# Patient Record
Sex: Male | Born: 1980 | Race: White | Hispanic: No | Marital: Married | Smoking: Former smoker
Health system: Southern US, Community
[De-identification: ages and names within clinical notes are randomized; demographics above are authoritative.]

## PROBLEM LIST (undated history)

## (undated) DIAGNOSIS — T8859XA Other complications of anesthesia, initial encounter: Secondary | ICD-10-CM

## (undated) DIAGNOSIS — R112 Nausea with vomiting, unspecified: Secondary | ICD-10-CM

## (undated) DIAGNOSIS — Z8489 Family history of other specified conditions: Secondary | ICD-10-CM

## (undated) HISTORY — PX: SHOULDER SURGERY: SHX246

## (undated) HISTORY — PX: HAND SURGERY: SHX662

---
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
## 2000-01-07 ENCOUNTER — Emergency Department (HOSPITAL_COMMUNITY): Admission: EM | Admit: 2000-01-07 | Discharge: 2000-01-07 | Payer: Self-pay | Admitting: Emergency Medicine

## 2002-06-27 ENCOUNTER — Ambulatory Visit (HOSPITAL_BASED_OUTPATIENT_CLINIC_OR_DEPARTMENT_OTHER): Admission: RE | Admit: 2002-06-27 | Discharge: 2002-06-27 | Payer: Self-pay | Admitting: Orthopedic Surgery

## 2005-06-16 ENCOUNTER — Emergency Department (HOSPITAL_COMMUNITY): Admission: EM | Admit: 2005-06-16 | Discharge: 2005-06-16 | Payer: Self-pay | Admitting: Emergency Medicine

## 2007-06-01 ENCOUNTER — Emergency Department (HOSPITAL_COMMUNITY): Admission: EM | Admit: 2007-06-01 | Discharge: 2007-06-01 | Payer: Self-pay | Admitting: Emergency Medicine

## 2012-02-13 ENCOUNTER — Emergency Department (HOSPITAL_COMMUNITY): Payer: BC Managed Care – PPO

## 2012-02-13 ENCOUNTER — Emergency Department (HOSPITAL_COMMUNITY)
Admission: EM | Admit: 2012-02-13 | Discharge: 2012-02-13 | Disposition: A | Discharge status: Home / Self Care | Payer: BC Managed Care – PPO | Source: Home / Self Care

## 2012-02-13 ENCOUNTER — Emergency Department (INDEPENDENT_AMBULATORY_CARE_PROVIDER_SITE_OTHER): Payer: BC Managed Care – PPO

## 2012-02-13 ENCOUNTER — Encounter (HOSPITAL_COMMUNITY): Payer: Self-pay | Admitting: Emergency Medicine

## 2012-02-13 DIAGNOSIS — S6390XA Sprain of unspecified part of unspecified wrist and hand, initial encounter: Secondary | ICD-10-CM

## 2012-02-13 DIAGNOSIS — S63619A Unspecified sprain of unspecified finger, initial encounter: Secondary | ICD-10-CM

## 2012-02-13 DIAGNOSIS — IMO0002 Reserved for concepts with insufficient information to code with codable children: Secondary | ICD-10-CM

## 2012-02-13 NOTE — ED Notes (Signed)
Reports receiving tetanus 2 weeks ago: 2013.
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
## 2012-02-14 ENCOUNTER — Other Ambulatory Visit: Payer: Self-pay | Admitting: Internal Medicine

## 2012-02-14 MED ORDER — MUPIROCIN 2 % EX OINT: TOPICAL_OINTMENT | Freq: Three times a day (TID) | CUTANEOUS | Status: DC

## 2012-02-16 NOTE — ED Provider Notes (Signed)
Medical screening examination/treatment/procedure(s) were performed by resident physician or non-physician practitioner and as supervising physician I was immediately available for consultation/collaboration.   Zaeem Kandel DOUGLAS MD.    Jestina Stephani D Arieh Bogue, MD 02/16/12 0830 

## 2012-02-20 ENCOUNTER — Telehealth: Payer: Self-pay | Admitting: Radiology

## 2012-02-20 NOTE — Telephone Encounter (Signed)
Rx for Mupirocin sent through in error, it is cancelled at [facility]

## 2012-12-18 ENCOUNTER — Ambulatory Visit: Payer: BC Managed Care – PPO | Admitting: Family Medicine

## 2012-12-18 VITALS — BP 112/62 | HR 60 | Temp 97.6°F | Resp 18 | Ht 75.0 in | Wt 207.0 lb

## 2012-12-18 DIAGNOSIS — R002 Palpitations: Secondary | ICD-10-CM

## 2012-12-18 LAB — POCT CBC
Granulocyte percent: 57.4 %G (ref 37–80)
HCT, POC: 44.5 % (ref 43.5–53.7)
Hemoglobin: 14.2 g/dL (ref 14.1–18.1)
Lymph, poc: 1.8 (ref 0.6–3.4)
MCH, POC: 30.9 pg (ref 27–31.2)
MCHC: 31.9 g/dL (ref 31.8–35.4)
MCV: 97 fL (ref 80–97)
MID (cbc): 0.4 (ref 0–0.9)
MPV: 12.4 fL (ref 0–99.8)
POC Granulocyte: 3 (ref 2–6.9)
POC LYMPH PERCENT: 34.4 % (ref 10–50)
POC MID %: 8.2 % (ref 0–12)
Platelet Count, POC: 133 10*3/uL — AB (ref 142–424)
RBC: 4.59 M/uL — AB (ref 4.69–6.13)
RDW, POC: 13.8 %
WBC: 5.3 10*3/uL (ref 4.6–10.2)

## 2012-12-18 NOTE — Progress Notes (Signed)
[facility]:  Office Visit  Chief Complaint:  Chief Complaint  Patient presents with  . Irregular Heart Beat    noticed saturday and again last night, unable to sleep     HPI: Ricky Berger is a 32 y.o. male who complains of  Intermittent midcentral non radiating chest palpitations, "feels like  Muscle spasm". Denies stimulant use excpet for alcohol prior, he denies any other sxs ie CP, SOB, diaphoresis, paresthesia, HA, n/v/abd pain Denies  HTN, XOL. Smoking-- quit 2 weeks ago, Saturday evening, intermittent palpitations started and lasted  for 30 minutes Happended again while at rest watching football, this second episode last night lasted for 45 minutes, Prior to this all starting on Saturday he had gone to a bachelor party and had drank a lot  Father has speeding heart beat.    History reviewed. No pertinent past medical history. Past Surgical History  Procedure Laterality Date  . Shoulder surgery     History   Social History  . Marital Status: Married    Spouse Name: N/A    Number of Children: N/A  . Years of Education: N/A   Social History Main Topics  . Smoking status: Former Games developer  . Smokeless tobacco: None  . Alcohol Use: Yes  . Drug Use: No  . Sexually Active: None   Other Topics Concern  . None   Social History Narrative  . None   History reviewed. No pertinent family history. No Known Allergies Prior to Admission medications   Medication Sig Start Date End Date Taking? Authorizing Provider  Celecoxib (CELEBREX PO) Take by mouth.    Historical Provider, MD     ROS: The patient denies fevers, chills, night sweats, unintentional weight loss, chest pain, wheezing, dyspnea on exertion, nausea, vomiting, abdominal pain, dysuria, hematuria, melena, numbness, weakness, or tingling.   All other systems have been reviewed and were otherwise negative with the exception of those mentioned in the HPI and as above.    PHYSICAL  EXAM: Filed Vitals:   12/18/12 1043  BP: 112/62  Pulse: 60  Temp: 97.6 F (36.4 C)  Resp: 18   Filed Vitals:   12/18/12 1043  Height: 6\' 3"  (1.905 m)  Weight: 207 lb (93.895 kg)   Body mass index is 25.87 kg/(m^2).  General: Alert, no acute distress HEENT:  Normocephalic, atraumatic, oropharynx patent.  Cardiovascular:  Regular rate and rhythm, no rubs murmurs or gallops.  No Carotid bruits, radial pulse intact. No pedal edema.  Respiratory: Clear to auscultation bilaterally.  No wheezes, rales, or rhonchi.  No cyanosis, no use of accessory musculature GI: No organomegaly, abdomen is soft and non-tender, positive bowel sounds.  No masses. Skin: No rashes. Neurologic: Facial musculature symmetric. Psychiatric: Patient is appropriate throughout our interaction. Lymphatic: No cervical lymphadenopathy Musculoskeletal: Gait intact.   LABS: Results for orders placed in visit on 12/18/12  POCT CBC      Result Value Range   WBC 5.3  4.6 - 10.2 K/uL   Lymph, poc 1.8  0.6 - 3.4   POC LYMPH PERCENT 34.4  10 - 50 %L   MID (cbc) 0.4  0 - 0.9   POC MID % 8.2  0 - 12 %M   POC Granulocyte 3.0  2 - 6.9   Granulocyte percent 57.4  37 - 80 %G   RBC 4.59 (*) 4.69 - 6.13 M/uL   Hemoglobin 14.2  14.1 - 18.1 g/dL   HCT, POC 16.1  09.6 -  53.7 %   MCV 97.0  80 - 97 fL   MCH, POC 30.9  27 - 31.2 pg   MCHC 31.9  31.8 - 35.4 g/dL   RDW, POC 96.0     Platelet Count, POC 133 (*) 142 - 424 K/uL   MPV 12.4  0 - 99.8 fL     EKG/XRAY:   Primary read interpreted by Dr. Conley Rolls at [facility]. EKG sinus brady without ST elevation/depression or Q waves, he is a cyclist   Encounter Diagnosis  Name Primary?  . Palpitations Yes   Labs pending Will consider event monitor if have recurret sxs F/u prn Go to ER prn   Adlai Nieblas PHUONG, DO 12/18/2012 5:32 PM

## 2012-12-18 NOTE — Patient Instructions (Addendum)
Palpitations  A palpitation is the feeling that your heartbeat is irregular or is faster than normal. It may feel like your heart is fluttering or skipping a beat. Palpitations are usually not a serious problem. However, in some cases, you may need further medical evaluation. CAUSES  Palpitations can be caused by:  Smoking.  Caffeine or other stimulants, such as diet pills or energy drinks.  Alcohol.  Stress and anxiety.  Strenuous physical activity.  Fatigue.  Certain medicines.  Heart disease, especially if you have a history of arrhythmias. This includes atrial fibrillation, atrial flutter, or supraventricular tachycardia.  An improperly working pacemaker or defibrillator. DIAGNOSIS  To find the cause of your palpitations, your caregiver will take your history and perform a physical exam. Tests may also be done, including:  Electrocardiography (ECG). This test records the heart's electrical activity.  Cardiac monitoring. This allows your caregiver to monitor your heart rate and rhythm in real time.  Holter monitor. This is a portable device that records your heartbeat and can help diagnose heart arrhythmias. It allows your caregiver to track your heart activity for several days, if needed.  Stress tests by exercise or by giving medicine that makes the heart beat faster. TREATMENT  Treatment of palpitations depends on the cause of your symptoms and can vary greatly. Most cases of palpitations do not require any treatment other than time, relaxation, and monitoring your symptoms. Other causes, such as atrial fibrillation, atrial flutter, or supraventricular tachycardia, usually require further treatment. HOME CARE INSTRUCTIONS   Avoid:  Caffeinated coffee, tea, soft drinks, diet pills, and energy drinks.  Chocolate.  Alcohol.  Stop smoking if you smoke.  Reduce your stress and anxiety. Things that can help you relax include:  A method that measures bodily functions so  you can learn to control them (biofeedback).  Yoga.  Meditation.  Physical activity such as swimming, jogging, or walking.  Get plenty of rest and sleep. SEEK MEDICAL CARE IF:   You continue to have a fast or irregular heartbeat beyond 24 hours.  Your palpitations occur more often. SEEK IMMEDIATE MEDICAL CARE IF:  You develop chest pain or shortness of breath.  You have a severe headache.  You feel dizzy, or you faint. MAKE SURE YOU:  Understand these instructions.  Will watch your condition.  Will get help right away if you are not doing well or get worse. Document Released: 05/05/2000 Document Revised: 11/07/2011 Document Reviewed: 07/07/2011 ExitCare Patient Information 2014 [employer].  

## 2012-12-19 ENCOUNTER — Telehealth: Payer: Self-pay

## 2012-12-19 DIAGNOSIS — R002 Palpitations: Secondary | ICD-10-CM

## 2012-12-19 LAB — COMPREHENSIVE METABOLIC PANEL WITH GFR
ALT: 19 IU/L (ref 0–44)
AST: 17 IU/L (ref 0–40)
Albumin/Globulin Ratio: 1.8 (ref 1.1–2.5)
Calcium: 9.5 mg/dL (ref 8.7–10.2)
Chloride: 104 mmol/L (ref 97–108)
GFR calc non Af Amer: 87 mL/min/{1.73_m2} (ref >59)
Potassium: 4.5 mmol/L (ref 3.5–5.2)
Sodium: 142 mmol/L (ref 134–144)
Total Bilirubin: 0.4 mg/dL (ref 0.0–1.2)

## 2012-12-19 LAB — COMPREHENSIVE METABOLIC PANEL
Albumin: 4.4 g/dL (ref 3.5–5.5)
Alkaline Phosphatase: 46 IU/L (ref 39–117)
BUN/Creatinine Ratio: 14 (ref 8–19)
BUN: 15 mg/dL (ref 6–20)
CO2: 28 mmol/L (ref 18–29)
Creatinine, Ser: 1.11 mg/dL (ref 0.76–1.27)
GFR calc Af Amer: 100 mL/min/{1.73_m2} (ref >59)
Globulin, Total: 2.4 g/dL (ref 1.5–4.5)
Glucose: 89 mg/dL (ref 65–99)
Total Protein: 6.8 g/dL (ref 6.0–8.5)

## 2012-12-19 LAB — TSH: TSH: 0.961 u[IU]/mL (ref 0.450–4.500)

## 2012-12-19 NOTE — Telephone Encounter (Signed)
Pt states that he was instructed to call Dr.Le if he had heart papillations again.  Best# [phone]

## 2012-12-19 NOTE — Telephone Encounter (Signed)
LM for patient taht I will order event monitor for him since he is having heart palpitations again. His labs results were also given. Advise to go to ER if have CP or SPB or other new symptoms

## 2013-04-08 ENCOUNTER — Other Ambulatory Visit: Payer: Self-pay | Admitting: Internal Medicine

## 2013-05-18 ENCOUNTER — Other Ambulatory Visit: Payer: Self-pay | Admitting: Oncology

## 2013-07-12 ENCOUNTER — Ambulatory Visit (INDEPENDENT_AMBULATORY_CARE_PROVIDER_SITE_OTHER): Payer: BC Managed Care – PPO | Admitting: Family Medicine

## 2013-07-12 VITALS — BP 102/60 | HR 60 | Temp 97.8°F | Resp 16 | Ht 74.75 in | Wt 210.0 lb

## 2013-07-12 DIAGNOSIS — N509 Disorder of male genital organs, unspecified: Secondary | ICD-10-CM

## 2013-07-12 DIAGNOSIS — N453 Epididymo-orchitis: Secondary | ICD-10-CM

## 2013-07-12 DIAGNOSIS — N5082 Scrotal pain: Secondary | ICD-10-CM

## 2013-07-12 DIAGNOSIS — N451 Epididymitis: Secondary | ICD-10-CM

## 2013-07-12 MED ORDER — DICLOFENAC SODIUM 75 MG PO TBEC: 75.0000 mg | DELAYED_RELEASE_TABLET | Freq: Two times a day (BID) | ORAL | Status: DC

## 2013-07-12 MED ORDER — DOXYCYCLINE HYCLATE 100 MG PO CAPS: 100.0000 mg | ORAL_CAPSULE | Freq: Two times a day (BID) | ORAL | Status: DC

## 2013-07-12 NOTE — Patient Instructions (Addendum)
Take diclofenac one twice daily with food Take doxycycline one twice daily   Avoid heavy lifting or straining  If symptoms persist in 10-14 days contact me for urology referral, sooner if worse    Epididymitis Epididymitis is a swelling (inflammation) of the epididymis. The epididymis is a cord-like structure along the back part of the testicle. Epididymitis is usually, but not always, caused by infection. This is usually a sudden problem beginning with chills, fever and pain behind the scrotum and in the testicle. There may be swelling and redness of the testicle. DIAGNOSIS  Physical examination will reveal a tender, swollen epididymis. Sometimes, cultures are obtained from the urine or from prostate secretions to help find out if there is an infection or if the cause is a different problem. Sometimes, blood work is performed to see if your white blood cell count is elevated and if a germ (bacterial) or viral infection is present. Using this knowledge, an appropriate medicine which kills germs (antibiotic) can be chosen by your caregiver. A viral infection causing epididymitis will most often go away (resolve) without treatment. HOME CARE INSTRUCTIONS   Hot sitz baths for 20 minutes, 4 times per day, may help relieve pain.  Only take over-the-counter or prescription medicines for pain, discomfort or fever as directed by your caregiver.  Take all medicines, including antibiotics, as directed. Take the antibiotics for the full prescribed length of time even if you are feeling better.  It is very important to keep all follow-up appointments. SEEK IMMEDIATE MEDICAL CARE IF:   You have a fever.  You have pain not relieved with medicines.  You have any worsening of your problems.  Your pain seems to come and go.  You develop pain, redness, and swelling in the scrotum and surrounding areas. MAKE SURE YOU:   Understand these instructions.  Will watch your condition.  Will get help  right away if you are not doing well or get worse. Document Released: 05/05/2000 Document Revised: 07/31/2011 Document Reviewed: 03/25/2009 [facility] Patient Information 2014 [employer]vant[employer]Care, [place].

## 2013-07-12 NOTE — Progress Notes (Signed)
Subjective: Patient has been having right scrotal pain for the last week. He thinks he may be getting a hernia. He has been doing weight lifting it hurts more then after doing that. 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 He was feeling better and the pain recurred. He has not felt any swelling or nodule. Otherwise he is healthy.  Objective: Normal male external genitalia. Testicles normal. He's tender along the right epididymis. No hernia could be detected. Digital rectal exam reveals prostate  Assessment: Epididymitis  Plan: Doxycycline for 10 days Diclofenac twice daily If symptoms persist he is to contact me and we'll make a referral to the urologist.

## 2013-10-10 ENCOUNTER — Ambulatory Visit: Payer: BC Managed Care – PPO

## 2013-10-10 ENCOUNTER — Ambulatory Visit: Payer: BC Managed Care – PPO | Admitting: Internal Medicine

## 2013-10-10 VITALS — BP 122/64 | HR 64 | Temp 98.7°F | Resp 16 | Ht 74.5 in | Wt 206.2 lb

## 2013-10-10 DIAGNOSIS — R0602 Shortness of breath: Secondary | ICD-10-CM

## 2013-10-10 MED ORDER — ALPRAZOLAM 0.25 MG PO TABS: ORAL_TABLET | ORAL | Status: DC

## 2013-10-10 NOTE — Progress Notes (Signed)
Subjective:    Patient ID: Ricky Berger, male    DOB: [birth year], 33 y.o.   MRN: [id number] This chart was scribed for Ellamae Sia, MD by Valera Castle, ED Scribe. This patient was seen in room 01 and the patient's Berger was started at 11:38 AM.  Chief Complaint  Patient presents with  . Anxiety    x 3 weeks   HPI Ricky Berger is a 33 y.o. male Pt presents with possible anxiety, onset 3 weeks ago.   He states he quit smoking 1 month ago. 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 He reports he has gotten into a phase where he has been focusing a lot on his breathing, worse in the evenings. He states if he doesn't focus on breathing he notices he will stop breathing. He states once he falls asleep he has no problem with breathing. He denies waking up with SOB. He thought he was having SOB, but denies any breathing trouble while he is running or bike riding. He is not sure if he is actually having a problem or if he is manifesting the trouble. He denies h/o similar symptoms and states he has never been a stressful person. He denies trouble with acid reflux, indigestion.   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 He states his father and him have a good relationship, denying the need for him to approve of his work. He states if he could just break his thought process, focusing on his breathing he would be fine. He states he does feel a pressure in his gut, would like to rule out physical cause.   Married w/ kids fam hx ocd and anx PCP - No PCP Per Patient  There are no active problems to display for this patient.  Prior to Admission medications   Not on File    Review of Systems  Constitutional: Negative for activity change, appetite change, fatigue and unexpected weight change.  HENT: Negative for congestion and postnasal drip.   Eyes: Negative for visual disturbance.  Respiratory: Negative for apnea, cough,  choking, chest tightness and wheezing.   Cardiovascular: Negative for chest pain.       Hx of palpitation evaluation by cardiology without the necessity for event monitor  Gastrointestinal:       No reflux  Endocrine: Negative for polydipsia and polyphagia.  Genitourinary: Negative for difficulty urinating.  Neurological: Negative for headaches.  Psychiatric/Behavioral: Negative for behavioral problems, sleep disturbance and dysphoric mood. The patient is nervous/anxious.   also reports recent trouble flying back from [place] with "panic " sxt    Objective:   Physical Exam  Nursing note and vitals reviewed. Constitutional: He is oriented to person, place, and time. He appears well-developed and well-nourished. No distress.  HENT:  Head: Normocephalic and atraumatic.  Eyes: EOM are normal.  Neck: Neck supple. No thyromegaly present.  Cardiovascular: Normal rate, regular rhythm, normal heart sounds and intact distal pulses.   No murmur heard. Pulmonary/Chest: Effort normal and breath sounds normal. No respiratory distress. He has no decreased breath sounds. He has no wheezes. He has no rhonchi. He has no rales.  Musculoskeletal: Normal range of motion.  Lymphadenopathy:    He has no cervical adenopathy.  Neurological: He is alert and oriented to person, place, and time.  Skin: Skin is warm and dry.  Psychiatric: He has a normal mood and affect. His behavior is normal. His mood appears not  anxious.   BP 122/64  Pulse 64  Temp(Src) 98.7 F (37.1 C) (Oral)  Resp 16  Ht 6' 2.5" (1.892 m)  Wt 206 lb 3.2 oz (93.532 kg)  BMI 26.13 kg/m2  SpO2 98%  [facility] reading (PRIMARY) by Dr. Merla Richesoolittle - CXR reading: No acute findings.     Assessment & Plan:  SOB (shortness of breath) - Plan: DG Chest 2 View reassur re nl findings Discussed mindfulness/options for reframing work/trial dose of alpraz to prove ease of sxt relief  F/u as needed Ref to OA for D  I have completed the patient  encounter in its entirety as documented by the scribe, with editing by me where necessary. Robert P. Merla Richesoolittle, M.D.

## 2014-07-01 ENCOUNTER — Emergency Department (HOSPITAL_COMMUNITY)
Admission: EM | Admit: 2014-07-01 | Discharge: 2014-07-01 | Disposition: A | Discharge status: Home / Self Care | Payer: Self-pay | Attending: Emergency Medicine | Admitting: Emergency Medicine

## 2014-07-01 ENCOUNTER — Encounter (HOSPITAL_COMMUNITY): Payer: Self-pay | Admitting: Emergency Medicine

## 2014-07-01 DIAGNOSIS — Z87891 Personal history of nicotine dependence: Secondary | ICD-10-CM | POA: Insufficient documentation

## 2014-07-01 DIAGNOSIS — R1084 Generalized abdominal pain: Secondary | ICD-10-CM | POA: Insufficient documentation

## 2014-07-01 LAB — COMPREHENSIVE METABOLIC PANEL
ALBUMIN: 4.6 g/dL (ref 3.5–5.2)
ALT: 22 U/L (ref 0–53)
ANION GAP: 6 (ref 5–15)
AST: 22 U/L (ref 0–37)
Alkaline Phosphatase: 47 U/L (ref 39–117)
BUN: 9 mg/dL (ref 6–23)
CHLORIDE: 101 mmol/L (ref 96–112)
CO2: 32 mmol/L (ref 19–32)
CREATININE: 1.21 mg/dL (ref 0.50–1.35)
Calcium: 9.6 mg/dL (ref 8.4–10.5)
GFR, EST AFRICAN AMERICAN: 88 mL/min — AB (ref >90)
GFR, EST NON AFRICAN AMERICAN: 76 mL/min — AB (ref >90)
Glucose, Bld: 109 mg/dL — ABNORMAL HIGH (ref 70–99)
Potassium: 4 mmol/L (ref 3.5–5.1)
Sodium: 139 mmol/L (ref 135–145)
Total Bilirubin: 1.2 mg/dL (ref 0.3–1.2)
Total Protein: 7.3 g/dL (ref 6.0–8.3)

## 2014-07-01 LAB — CBC WITH DIFFERENTIAL/PLATELET
Basophils Absolute: 0 10*3/uL (ref 0.0–0.1)
Basophils Relative: 0 % (ref 0–1)
Eosinophils Absolute: 0.1 10*3/uL (ref 0.0–0.7)
Eosinophils Relative: 1 % (ref 0–5)
HCT: 43.3 % (ref 39.0–52.0)
Hemoglobin: 14.7 g/dL (ref 13.0–17.0)
LYMPHS PCT: 11 % — AB (ref 12–46)
Lymphs Abs: 1.5 10*3/uL (ref 0.7–4.0)
MCH: 31.1 pg (ref 26.0–34.0)
MCHC: 33.9 g/dL (ref 30.0–36.0)
MCV: 91.5 fL (ref 78.0–100.0)
Monocytes Absolute: 0.8 10*3/uL (ref 0.1–1.0)
Monocytes Relative: 6 % (ref 3–12)
NEUTROS ABS: 10.6 10*3/uL — AB (ref 1.7–7.7)
Neutrophils Relative %: 82 % — ABNORMAL HIGH (ref 43–77)
PLATELETS: 160 10*3/uL (ref 150–400)
RBC: 4.73 MIL/uL (ref 4.22–5.81)
RDW: 12.9 % (ref 11.5–15.5)
WBC: 13 10*3/uL — AB (ref 4.0–10.5)

## 2014-07-01 LAB — LIPASE, BLOOD: LIPASE: 30 U/L (ref 11–59)

## 2014-07-01 MED ORDER — OXYCODONE-ACETAMINOPHEN 5-325 MG PO TABS: 1.0000 | ORAL_TABLET | ORAL | Status: DC | PRN

## 2014-07-01 MED ORDER — HYOSCYAMINE SULFATE 0.125 MG SL SUBL: 0.1250 mg | SUBLINGUAL_TABLET | SUBLINGUAL | Status: DC | PRN

## 2014-07-01 MED ORDER — HYOSCYAMINE SULFATE 0.125 MG PO TABS
0.1250 mg | ORAL_TABLET | Freq: Once | ORAL | Status: DC
  Filled 2014-07-01: qty 1

## 2014-07-01 MED ORDER — HYOSCYAMINE SULFATE 0.125 MG PO TABS: 0.1250 mg | ORAL_TABLET | Freq: Once | ORAL | Status: DC

## 2014-07-01 MED ORDER — HYOSCYAMINE SULFATE 0.125 MG PO TABS
0.1250 mg | ORAL_TABLET | Freq: Once | ORAL | Status: AC
  Administered 2014-07-01: 0.125 mg via ORAL
  Filled 2014-07-01: qty 1

## 2014-07-01 MED ORDER — OXYCODONE-ACETAMINOPHEN 5-325 MG PO TABS
1.0000 | ORAL_TABLET | Freq: Once | ORAL | Status: AC
  Administered 2014-07-01: 1 via ORAL
  Filled 2014-07-01: qty 1

## 2014-07-01 NOTE — ED Notes (Signed)
Pt. reports mid/RLQ pain onset today , denies emesis or diarrhea.

## 2014-07-01 NOTE — Discharge Instructions (Signed)

## 2014-07-01 NOTE — ED Provider Notes (Signed)
CSN: [id number]     Arrival date & time 07/01/14  2108 History   First MD Initiated Contact with Patient 07/01/14 2140     Chief Complaint  Patient presents with  . Abdominal Pain     (Consider location/radiation/quality/duration/timing/severity/associated sxs/prior Treatment) HPI   Ricky Berger is a 34 y.o. male who presents for evaluation of mid upper abdominal pain which started 6 hours ago, and is felt as a cramping sensation.  The pain has been constant since that time.  The pain started fairly quickly.  He has not had nausea, vomiting or diarrhea.  He denies fever, chills, cough, shortness of breath or chest pain.  He tried taking Gas-X, sitting in a hot tub and doing yoga, without relief of the discomfort.  He's never had this before.  He does not have any known sick contacts, or suspected abnormal food ingestions.  There are no other known modifying factors.   History reviewed. No pertinent past medical history. Past Surgical History  Procedure Laterality Date  . Shoulder surgery     No family history on file. History  Substance Use Topics  . Smoking status: Former Games developer  . Smokeless tobacco: Not on file  . Alcohol Use: Yes    Review of Systems  All other systems reviewed and are negative.     Allergies  Review of patient's allergies indicates no known allergies.  Home Medications   Prior to Admission medications   Medication Sig Start Date End Date Taking? Authorizing Provider  ALPRAZolam Prudy Feeler) 0.25 MG tablet Take one at onset of symptoms if needed 10/10/13   Tonye Pearson, MD  hyoscyamine (LEVSIN/SL) 0.125 MG SL tablet Place 1 tablet (0.125 mg total) under the tongue every 4 (four) hours as needed for cramping. 07/01/14   Flint Melter, MD  oxyCODONE-acetaminophen (PERCOCET) 5-325 MG per tablet Take 1 tablet by mouth every 4 (four) hours as needed for severe pain. 07/01/14   Flint Melter, MD   BP 112/47 mmHg  Pulse 65  Temp(Src) 97.6 F  (36.4 C) (Oral)  Resp 18  SpO2 98% Physical Exam  Constitutional: He is oriented to person, place, and time. He appears well-developed and well-nourished.  HENT:  Head: Normocephalic and atraumatic.  Right Ear: External ear normal.  Left Ear: External ear normal.  Eyes: Conjunctivae and EOM are normal. Pupils are equal, round, and reactive to light.  Neck: Normal range of motion and phonation normal. Neck supple.  Cardiovascular: Normal rate, regular rhythm and normal heart sounds.   Pulmonary/Chest: Effort normal and breath sounds normal. He exhibits no bony tenderness.  Abdominal: Soft. There is no tenderness.  Hyperactive bowel sounds, soft, mild tenderness above the umbilicus, without rebound tenderness or significant guarding.  There is no right lower quadrant tenderness.  Musculoskeletal: Normal range of motion.  Neurological: He is alert and oriented to person, place, and time. No cranial nerve deficit or sensory deficit. He exhibits normal muscle tone. Coordination normal.  Skin: Skin is warm, dry and intact.  Psychiatric: He has a normal mood and affect. His behavior is normal. Judgment and thought content normal.  Nursing note and vitals reviewed.   ED Course  Procedures (including critical care time) Medications  oxyCODONE-acetaminophen (PERCOCET/ROXICET) 5-325 MG per tablet 1 tablet (1 tablet Oral Given 07/01/14 2209)  hyoscyamine (LEVSIN, ANASPAZ) tablet 0.125 mg (0.125 mg Oral Given 07/01/14 2215)    Patient Vitals for the past 24 hrs:  BP Temp Temp src Pulse Resp SpO2  07/01/14 2133 (!) 112/47 mmHg - - 65 - 98 %  07/01/14 2115 119/56 mmHg 97.6 F (36.4 C) Oral 82 18 98 %    10:41 PM Reevaluation with update and discussion. After initial assessment and treatment, an updated evaluation reveals he feels better now.  Findings discussed with patient and father, all questions answered.Mancel Bale. Asna Muldrow L    Labs Review Labs Reviewed  CBC WITH DIFFERENTIAL/PLATELET -  Abnormal; Notable for the following:    WBC 13.0 (*)    Neutrophils Relative % 82 (*)    Neutro Abs 10.6 (*)    Lymphocytes Relative 11 (*)    All other components within normal limits  COMPREHENSIVE METABOLIC PANEL  LIPASE, BLOOD  URINALYSIS, ROUTINE W REFLEX MICROSCOPIC    Imaging Review No results found.   EKG Interpretation None      MDM   Final diagnoses:  Generalized abdominal pain    Nonspecific abdominal pain, likely intestinal related.  At this time, on initial exam for the short-term illness I doubt appendicitis, colitis, serious bacterial infection, metabolic instability or impending vascular collapse.   Nursing Notes Reviewed/ Care Coordinated Applicable Imaging Reviewed Interpretation of Laboratory Data incorporated into ED treatment  The patient appears reasonably screened and/or stabilized for discharge and I doubt any other medical condition or other [facility] requiring further screening, evaluation, or treatment in the ED at this time prior to discharge.  Plan: Home Medications- Levsin, Percocet; Home Treatments- rest, gradually advance diet.; return here if the recommended treatment, does not improve the symptoms; Recommended follow up- Return prn for worsening sx, then consider CT imaging.     Flint MelterElliott L Cherrill Scrima, MD 07/01/14 [phone]

## 2014-09-10 ENCOUNTER — Encounter (HOSPITAL_COMMUNITY): Payer: Self-pay | Admitting: Cardiology

## 2014-09-10 ENCOUNTER — Emergency Department (HOSPITAL_COMMUNITY)
Admission: EM | Admit: 2014-09-10 | Discharge: 2014-09-10 | Disposition: A | Discharge status: Home / Self Care | Payer: BLUE CROSS/BLUE SHIELD | Attending: Emergency Medicine | Admitting: Emergency Medicine

## 2014-09-10 ENCOUNTER — Emergency Department (HOSPITAL_COMMUNITY): Payer: BLUE CROSS/BLUE SHIELD

## 2014-09-10 DIAGNOSIS — Z23 Encounter for immunization: Secondary | ICD-10-CM | POA: Diagnosis not present

## 2014-09-10 DIAGNOSIS — Z87891 Personal history of nicotine dependence: Secondary | ICD-10-CM | POA: Diagnosis not present

## 2014-09-10 DIAGNOSIS — S81812A Laceration without foreign body, left lower leg, initial encounter: Secondary | ICD-10-CM | POA: Diagnosis not present

## 2014-09-10 DIAGNOSIS — Y999 Unspecified external cause status: Secondary | ICD-10-CM | POA: Insufficient documentation

## 2014-09-10 DIAGNOSIS — Y9289 Other specified places as the place of occurrence of the external cause: Secondary | ICD-10-CM | POA: Diagnosis not present

## 2014-09-10 DIAGNOSIS — Y939 Activity, unspecified: Secondary | ICD-10-CM | POA: Diagnosis not present

## 2014-09-10 DIAGNOSIS — Y9355 Activity, bike riding: Secondary | ICD-10-CM | POA: Diagnosis not present

## 2014-09-10 DIAGNOSIS — S8992XA Unspecified injury of left lower leg, initial encounter: Secondary | ICD-10-CM | POA: Diagnosis present

## 2014-09-10 MED ORDER — PENICILLIN G BENZATHINE 1200000 UNIT/2ML IM SUSP: 1.2000 10*6.[IU] | Freq: Once | INTRAMUSCULAR | Status: DC

## 2014-09-10 MED ORDER — CEPHALEXIN 250 MG PO CAPS
1000.0000 mg | ORAL_CAPSULE | Freq: Once | ORAL | Status: AC
  Administered 2014-09-10: 1000 mg via ORAL
  Filled 2014-09-10: qty 4

## 2014-09-10 MED ORDER — LIDOCAINE-EPINEPHRINE (PF) 2 %-1:200000 IJ SOLN
10.0000 mL | Freq: Once | INTRAMUSCULAR | Status: AC
  Administered 2014-09-10: 10 mL via INTRADERMAL
  Filled 2014-09-10: qty 20

## 2014-09-10 MED ORDER — OXYCODONE-ACETAMINOPHEN 5-325 MG PO TABS: 1.0000 | ORAL_TABLET | Freq: Four times a day (QID) | ORAL | Status: DC | PRN

## 2014-09-10 MED ORDER — TETANUS-DIPHTH-ACELL PERTUSSIS 5-2.5-18.5 LF-MCG/0.5 IM SUSP
0.5000 mL | Freq: Once | INTRAMUSCULAR | Status: AC
  Administered 2014-09-10: 0.5 mL via INTRAMUSCULAR
  Filled 2014-09-10: qty 0.5

## 2014-09-10 MED ORDER — OXYCODONE-ACETAMINOPHEN 5-325 MG PO TABS
1.0000 | ORAL_TABLET | Freq: Once | ORAL | Status: AC
  Administered 2014-09-10: 1 via ORAL
  Filled 2014-09-10: qty 1

## 2014-09-10 MED ORDER — CEPHALEXIN 500 MG PO CAPS: 1000.0000 mg | ORAL_CAPSULE | Freq: Two times a day (BID) | ORAL | Status: DC
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
## 2014-09-10 NOTE — Discharge Instructions (Signed)
Delayed Wound Closure °Sometimes, your health care provider will decide to delay closing a wound for several days. This is done when the wound is badly bruised, dirty, or when it has been several hours since the injury happened. By delaying the closure of your wound, the risk of infection is reduced. Wounds that are closed in 3-7 days after being cleaned up and dressed heal just as well as those that are closed right away. °HOME CARE INSTRUCTIONS °· Rest and elevate the injured area until the pain and swelling are gone. °· Have your wound checked as instructed by your health care provider. °SEEK MEDICAL CARE IF: °· You develop unusual or increased swelling or redness around the wound. °· You have increasing pain or tenderness. °· There is increasing fluid (drainage) or a bad smelling drainage coming from the wound. °Document Released: 05/08/2005 Document Revised: 05/13/2013 Document Reviewed: 11/05/2012 °Exit[employer]® Patient Information ©2015 [employer]. This information is not intended to replace advice given to you by your health care provider. Make sure you discuss any questions you have with your health care provider. ° °

## 2014-09-12 ENCOUNTER — Encounter (HOSPITAL_COMMUNITY): Payer: Self-pay | Admitting: Emergency Medicine

## 2014-09-12 ENCOUNTER — Observation Stay (HOSPITAL_COMMUNITY)
Admission: EM | Admit: 2014-09-12 | Discharge: 2014-09-13 | Disposition: A | Discharge status: Home / Self Care | Payer: BLUE CROSS/BLUE SHIELD | Attending: Internal Medicine | Admitting: Internal Medicine

## 2014-09-12 ENCOUNTER — Encounter (HOSPITAL_COMMUNITY): Payer: Self-pay

## 2014-09-12 ENCOUNTER — Inpatient Hospital Stay (HOSPITAL_COMMUNITY): Payer: BLUE CROSS/BLUE SHIELD

## 2014-09-12 ENCOUNTER — Emergency Department (INDEPENDENT_AMBULATORY_CARE_PROVIDER_SITE_OTHER)
Admission: EM | Admit: 2014-09-12 | Discharge: 2014-09-12 | Disposition: A | Discharge status: Home / Self Care | Payer: BLUE CROSS/BLUE SHIELD | Source: Home / Self Care | Attending: Family Medicine | Admitting: Family Medicine

## 2014-09-12 DIAGNOSIS — Y92821 Forest as the place of occurrence of the external cause: Secondary | ICD-10-CM

## 2014-09-12 DIAGNOSIS — L03116 Cellulitis of left lower limb: Secondary | ICD-10-CM | POA: Diagnosis present

## 2014-09-12 DIAGNOSIS — W458XXA Other foreign body or object entering through skin, initial encounter: Secondary | ICD-10-CM | POA: Insufficient documentation

## 2014-09-12 DIAGNOSIS — S43402A Unspecified sprain of left shoulder joint, initial encounter: Secondary | ICD-10-CM | POA: Diagnosis not present

## 2014-09-12 DIAGNOSIS — T798XXA Other early complications of trauma, initial encounter: Secondary | ICD-10-CM

## 2014-09-12 DIAGNOSIS — W208XXA Other cause of strike by thrown, projected or falling object, initial encounter: Secondary | ICD-10-CM | POA: Insufficient documentation

## 2014-09-12 DIAGNOSIS — Y9355 Activity, bike riding: Secondary | ICD-10-CM | POA: Insufficient documentation

## 2014-09-12 DIAGNOSIS — S63614A Unspecified sprain of right ring finger, initial encounter: Secondary | ICD-10-CM

## 2014-09-12 DIAGNOSIS — S81842A Puncture wound with foreign body, left lower leg, initial encounter: Principal | ICD-10-CM | POA: Diagnosis present

## 2014-09-12 DIAGNOSIS — T148XXA Other injury of unspecified body region, initial encounter: Secondary | ICD-10-CM

## 2014-09-12 DIAGNOSIS — B9689 Other specified bacterial agents as the cause of diseases classified elsewhere: Secondary | ICD-10-CM

## 2014-09-12 DIAGNOSIS — R52 Pain, unspecified: Secondary | ICD-10-CM

## 2014-09-12 DIAGNOSIS — L089 Local infection of the skin and subcutaneous tissue, unspecified: Secondary | ICD-10-CM | POA: Diagnosis present

## 2014-09-12 DIAGNOSIS — Z87891 Personal history of nicotine dependence: Secondary | ICD-10-CM | POA: Insufficient documentation

## 2014-09-12 DIAGNOSIS — S81832A Puncture wound without foreign body, left lower leg, initial encounter: Secondary | ICD-10-CM

## 2014-09-12 LAB — PROTIME-INR
INR: 1.1 (ref 0.00–1.49)
PROTHROMBIN TIME: 14.3 s (ref 11.6–15.2)

## 2014-09-12 LAB — BASIC METABOLIC PANEL
ANION GAP: 9 (ref 5–15)
BUN: 12 mg/dL (ref 6–23)
CHLORIDE: 100 mmol/L (ref 96–112)
CO2: 29 mmol/L (ref 19–32)
CREATININE: 1.02 mg/dL (ref 0.50–1.35)
Calcium: 9.2 mg/dL (ref 8.4–10.5)
GFR calc non Af Amer: >90 mL/min (ref >90)
Glucose, Bld: 95 mg/dL (ref 70–99)
POTASSIUM: 4.3 mmol/L (ref 3.5–5.1)
SODIUM: 138 mmol/L (ref 135–145)

## 2014-09-12 LAB — HEPATIC FUNCTION PANEL
ALT: 21 U/L (ref 0–53)
AST: 19 U/L (ref 0–37)
Albumin: 3.5 g/dL (ref 3.5–5.2)
Alkaline Phosphatase: 37 U/L — ABNORMAL LOW (ref 39–117)
Bilirubin, Direct: 0.2 mg/dL (ref 0.0–0.5)
Indirect Bilirubin: 1 mg/dL — ABNORMAL HIGH (ref 0.3–0.9)
TOTAL PROTEIN: 6.4 g/dL (ref 6.0–8.3)
Total Bilirubin: 1.2 mg/dL (ref 0.3–1.2)

## 2014-09-12 LAB — CBC WITH DIFFERENTIAL/PLATELET
Basophils Absolute: 0 10*3/uL (ref 0.0–0.1)
Basophils Relative: 0 % (ref 0–1)
EOS ABS: 0.2 10*3/uL (ref 0.0–0.7)
EOS PCT: 2 % (ref 0–5)
HEMATOCRIT: 41.6 % (ref 39.0–52.0)
HEMOGLOBIN: 14.1 g/dL (ref 13.0–17.0)
LYMPHS ABS: 1.9 10*3/uL (ref 0.7–4.0)
Lymphocytes Relative: 19 % (ref 12–46)
MCH: 30.5 pg (ref 26.0–34.0)
MCHC: 33.9 g/dL (ref 30.0–36.0)
MCV: 90 fL (ref 78.0–100.0)
MONOS PCT: 7 % (ref 3–12)
Monocytes Absolute: 0.7 10*3/uL (ref 0.1–1.0)
NEUTROS PCT: 72 % (ref 43–77)
Neutro Abs: 7.4 10*3/uL (ref 1.7–7.7)
Platelets: 153 10*3/uL (ref 150–400)
RBC: 4.62 MIL/uL (ref 4.22–5.81)
RDW: 12.6 % (ref 11.5–15.5)
WBC: 10.3 10*3/uL (ref 4.0–10.5)

## 2014-09-12 LAB — MAGNESIUM: MAGNESIUM: 1.8 mg/dL (ref 1.5–2.5)

## 2014-09-12 LAB — PHOSPHORUS: PHOSPHORUS: 3.5 mg/dL (ref 2.3–4.6)

## 2014-09-12 LAB — MRSA PCR SCREENING: MRSA BY PCR: NEGATIVE

## 2014-09-12 LAB — I-STAT CG4 LACTIC ACID, ED: Lactic Acid, Venous: 0.96 mmol/L (ref 0.5–2.0)

## 2014-09-12 MED ORDER — CIPROFLOXACIN IN D5W 400 MG/200ML IV SOLN
400.0000 mg | Freq: Once | INTRAVENOUS | Status: AC
  Administered 2014-09-12: 400 mg via INTRAVENOUS
  Filled 2014-09-12: qty 200

## 2014-09-12 MED ORDER — ONDANSETRON HCL 4 MG/2ML IJ SOLN: 4.0000 mg | Freq: Three times a day (TID) | INTRAMUSCULAR | Status: DC | PRN

## 2014-09-12 MED ORDER — ONDANSETRON HCL 4 MG/2ML IJ SOLN
4.0000 mg | Freq: Once | INTRAMUSCULAR | Status: AC
  Administered 2014-09-12: 4 mg via INTRAVENOUS
  Filled 2014-09-12: qty 2

## 2014-09-12 MED ORDER — ACETAMINOPHEN 325 MG PO TABS
650.0000 mg | ORAL_TABLET | Freq: Four times a day (QID) | ORAL | Status: DC | PRN
  Administered 2014-09-13: 650 mg via ORAL

## 2014-09-12 MED ORDER — HYDROMORPHONE HCL 1 MG/ML IJ SOLN
0.5000 mg | Freq: Once | INTRAMUSCULAR | Status: AC
  Administered 2014-09-12: 0.5 mg via INTRAVENOUS
  Filled 2014-09-12: qty 1

## 2014-09-12 MED ORDER — HYDROMORPHONE HCL 1 MG/ML IJ SOLN: 0.5000 mg | INTRAMUSCULAR | Status: DC | PRN

## 2014-09-12 MED ORDER — HEPARIN SODIUM (PORCINE) 5000 UNIT/ML IJ SOLN
5000.0000 [IU] | Freq: Three times a day (TID) | INTRAMUSCULAR | Status: DC
  Administered 2014-09-12 – 2014-09-13 (×4): 5000 [IU] via SUBCUTANEOUS
  Filled 2014-09-12 (×4): qty 1

## 2014-09-12 MED ORDER — ONDANSETRON HCL 4 MG/2ML IJ SOLN: 4.0000 mg | Freq: Four times a day (QID) | INTRAMUSCULAR | Status: DC | PRN

## 2014-09-12 MED ORDER — OXYCODONE-ACETAMINOPHEN 5-325 MG PO TABS
1.0000 | ORAL_TABLET | Freq: Four times a day (QID) | ORAL | Status: DC | PRN
  Administered 2014-09-12 – 2014-09-13 (×3): 1 via ORAL
  Filled 2014-09-12 (×3): qty 1

## 2014-09-12 MED ORDER — ENOXAPARIN SODIUM 40 MG/0.4ML ~~LOC~~ SOLN: 40.0000 mg | SUBCUTANEOUS | Status: DC

## 2014-09-12 MED ORDER — VANCOMYCIN HCL 10 G IV SOLR
1500.0000 mg | Freq: Two times a day (BID) | INTRAVENOUS | Status: DC
  Administered 2014-09-12 – 2014-09-13 (×2): 1500 mg via INTRAVENOUS
  Filled 2014-09-12 (×3): qty 1500

## 2014-09-12 MED ORDER — CEFTRIAXONE SODIUM 1 G IJ SOLR
1.0000 g | INTRAMUSCULAR | Status: DC
  Filled 2014-09-12: qty 10

## 2014-09-12 MED ORDER — VANCOMYCIN HCL IN DEXTROSE 1-5 GM/200ML-% IV SOLN
1000.0000 mg | Freq: Once | INTRAVENOUS | Status: AC
  Administered 2014-09-12: 1000 mg via INTRAVENOUS
  Filled 2014-09-12: qty 200

## 2014-09-12 MED ORDER — ACETAMINOPHEN 650 MG RE SUPP: 650.0000 mg | Freq: Four times a day (QID) | RECTAL | Status: DC | PRN

## 2014-09-12 MED ORDER — SODIUM CHLORIDE 0.9 % IV SOLN
INTRAVENOUS | Status: AC
  Administered 2014-09-12: 16:00:00 via INTRAVENOUS

## 2014-09-12 NOTE — ED Notes (Signed)
Pt here for wound check to left lower leg from 2 days ago. [facility]. Pt has increase in redness, swelling and pain when bearing weight.
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
## 2014-09-13 ENCOUNTER — Encounter (HOSPITAL_COMMUNITY)
Admission: EM | Disposition: A | Discharge status: Home / Self Care | Payer: Self-pay | Source: Home / Self Care | Attending: Internal Medicine

## 2014-09-13 ENCOUNTER — Observation Stay (HOSPITAL_COMMUNITY): Payer: BLUE CROSS/BLUE SHIELD | Admitting: Anesthesiology

## 2014-09-13 ENCOUNTER — Observation Stay (HOSPITAL_COMMUNITY): Payer: BLUE CROSS/BLUE SHIELD

## 2014-09-13 DIAGNOSIS — L03116 Cellulitis of left lower limb: Secondary | ICD-10-CM | POA: Diagnosis not present

## 2014-09-13 DIAGNOSIS — S81842A Puncture wound with foreign body, left lower leg, initial encounter: Secondary | ICD-10-CM | POA: Diagnosis not present

## 2014-09-13 DIAGNOSIS — B9689 Other specified bacterial agents as the cause of diseases classified elsewhere: Secondary | ICD-10-CM | POA: Diagnosis not present

## 2014-09-13 HISTORY — PX: I&D EXTREMITY: SHX5045

## 2014-09-13 HISTORY — PX: I & D EXTREMITY: SHX5045

## 2014-09-13 LAB — HIV ANTIBODY (ROUTINE TESTING W REFLEX): HIV Screen 4th Generation wRfx: NONREACTIVE

## 2014-09-13 SURGERY — IRRIGATION AND DEBRIDEMENT EXTREMITY
Anesthesia: General | Site: Leg Lower | Laterality: Left

## 2014-09-13 MED ORDER — LIDOCAINE HCL (CARDIAC) 20 MG/ML IV SOLN
INTRAVENOUS | Status: AC
  Filled 2014-09-13: qty 5

## 2014-09-13 MED ORDER — OXYCODONE HCL 5 MG/5ML PO SOLN: 5.0000 mg | Freq: Once | ORAL | Status: AC | PRN

## 2014-09-13 MED ORDER — LACTATED RINGERS IV SOLN
INTRAVENOUS | Status: DC | PRN
  Administered 2014-09-13 (×2): via INTRAVENOUS

## 2014-09-13 MED ORDER — OXYCODONE HCL 5 MG PO TABS
ORAL_TABLET | ORAL | Status: AC
  Filled 2014-09-13: qty 1

## 2014-09-13 MED ORDER — HYDROMORPHONE HCL 1 MG/ML IJ SOLN
INTRAMUSCULAR | Status: AC
  Filled 2014-09-13: qty 1

## 2014-09-13 MED ORDER — PROPOFOL 10 MG/ML IV BOLUS
INTRAVENOUS | Status: DC | PRN
  Administered 2014-09-13: 200 mg via INTRAVENOUS

## 2014-09-13 MED ORDER — OXYCODONE-ACETAMINOPHEN 5-325 MG PO TABS: 1.0000 | ORAL_TABLET | Freq: Four times a day (QID) | ORAL | Status: DC | PRN

## 2014-09-13 MED ORDER — GLYCOPYRROLATE 0.2 MG/ML IJ SOLN
INTRAMUSCULAR | Status: AC
Start: 2014-09-13 — End: 2014-09-13
  Filled 2014-09-13: qty 1

## 2014-09-13 MED ORDER — PROPOFOL 10 MG/ML IV BOLUS
INTRAVENOUS | Status: AC
  Filled 2014-09-13: qty 20

## 2014-09-13 MED ORDER — SULFAMETHOXAZOLE-TRIMETHOPRIM 800-160 MG PO TABS
2.0000 | ORAL_TABLET | Freq: Two times a day (BID) | ORAL | Status: DC
  Administered 2014-09-13: 2 via ORAL
  Filled 2014-09-13: qty 2

## 2014-09-13 MED ORDER — HYDROMORPHONE HCL 1 MG/ML IJ SOLN
INTRAMUSCULAR | Status: AC
  Administered 2014-09-13: 0.5 mg via INTRAVENOUS
  Filled 2014-09-13: qty 1

## 2014-09-13 MED ORDER — ONDANSETRON HCL 4 MG/2ML IJ SOLN: 4.0000 mg | Freq: Four times a day (QID) | INTRAMUSCULAR | Status: DC | PRN

## 2014-09-13 MED ORDER — HYDROMORPHONE HCL 1 MG/ML IJ SOLN
0.2500 mg | INTRAMUSCULAR | Status: DC | PRN
  Administered 2014-09-13 (×4): 0.5 mg via INTRAVENOUS

## 2014-09-13 MED ORDER — SULFAMETHOXAZOLE-TRIMETHOPRIM 800-160 MG PO TABS: 2.0000 | ORAL_TABLET | Freq: Two times a day (BID) | ORAL | Status: DC

## 2014-09-13 MED ORDER — 0.9 % SODIUM CHLORIDE (POUR BTL) OPTIME
TOPICAL | Status: DC | PRN
  Administered 2014-09-13: 1000 mL

## 2014-09-13 MED ORDER — FENTANYL CITRATE (PF) 250 MCG/5ML IJ SOLN
INTRAMUSCULAR | Status: AC
  Filled 2014-09-13: qty 5

## 2014-09-13 MED ORDER — SUCCINYLCHOLINE CHLORIDE 20 MG/ML IJ SOLN
INTRAMUSCULAR | Status: AC
  Filled 2014-09-13: qty 1

## 2014-09-13 MED ORDER — LIDOCAINE HCL (CARDIAC) 20 MG/ML IV SOLN
INTRAVENOUS | Status: DC | PRN
Start: 2014-09-13 — End: 2014-09-13
  Administered 2014-09-13: 75 mg via INTRAVENOUS

## 2014-09-13 MED ORDER — OXYCODONE HCL 5 MG PO TABS
5.0000 mg | ORAL_TABLET | Freq: Once | ORAL | Status: AC | PRN
Start: 2014-09-13 — End: 2014-09-13
  Administered 2014-09-13: 5 mg via ORAL

## 2014-09-13 MED ORDER — SODIUM CHLORIDE 0.9 % IR SOLN
Status: DC | PRN
  Administered 2014-09-13: 6000 mL

## 2014-09-13 MED ORDER — ACETAMINOPHEN 325 MG PO TABS
ORAL_TABLET | ORAL | Status: AC
  Filled 2014-09-13: qty 2

## 2014-09-13 MED ORDER — MIDAZOLAM HCL 2 MG/2ML IJ SOLN
INTRAMUSCULAR | Status: AC
Start: 2014-09-13 — End: 2014-09-13
  Filled 2014-09-13: qty 2

## 2014-09-13 MED ORDER — FENTANYL CITRATE (PF) 100 MCG/2ML IJ SOLN
INTRAMUSCULAR | Status: DC | PRN
  Administered 2014-09-13 (×5): 50 ug via INTRAVENOUS

## 2014-09-13 MED ORDER — MIDAZOLAM HCL 5 MG/5ML IJ SOLN
INTRAMUSCULAR | Status: DC | PRN
  Administered 2014-09-13: 2 mg via INTRAVENOUS

## 2014-09-13 SURGICAL SUPPLY — 47 items
BANDAGE ELASTIC 4 VELCRO ST LF (GAUZE/BANDAGES/DRESSINGS) IMPLANT
BANDAGE ELASTIC 6 VELCRO ST LF (GAUZE/BANDAGES/DRESSINGS) ×3 IMPLANT
BLADE SURG 10 STRL SS (BLADE) ×3 IMPLANT
BNDG COHESIVE 4X5 TAN STRL (GAUZE/BANDAGES/DRESSINGS) IMPLANT
BNDG GAUZE ELAST 4 BULKY (GAUZE/BANDAGES/DRESSINGS) ×3 IMPLANT
BOOTCOVER CLEANROOM LRG (PROTECTIVE WEAR) IMPLANT
COVER SURGICAL LIGHT HANDLE (MISCELLANEOUS) ×3 IMPLANT
CUFF TOURNIQUET SINGLE 34IN LL (TOURNIQUET CUFF) IMPLANT
DRAPE C-ARM 42X72 X-RAY (DRAPES) ×3 IMPLANT
DRSG ADAPTIC 3X8 NADH LF (GAUZE/BANDAGES/DRESSINGS) ×3 IMPLANT
DRSG PAD ABDOMINAL 8X10 ST (GAUZE/BANDAGES/DRESSINGS) ×3 IMPLANT
DURAPREP 26ML APPLICATOR (WOUND CARE) ×3 IMPLANT
ELECT REM PT RETURN 9FT ADLT (ELECTROSURGICAL)
ELECTRODE REM PT RTRN 9FT ADLT (ELECTROSURGICAL) IMPLANT
EVACUATOR 1/8 PVC DRAIN (DRAIN) IMPLANT
FACESHIELD WRAPAROUND (MASK) ×9 IMPLANT
GAUZE SPONGE 4X4 12PLY STRL (GAUZE/BANDAGES/DRESSINGS) ×3 IMPLANT
GAUZE XEROFORM 1X8 LF (GAUZE/BANDAGES/DRESSINGS) IMPLANT
GLOVE BIO SURGEON STRL SZ7 (GLOVE) ×3 IMPLANT
GLOVE BIO SURGEON STRL SZ8 (GLOVE) ×3 IMPLANT
GLOVE BIOGEL PI IND STRL 7.0 (GLOVE) ×1 IMPLANT
GLOVE BIOGEL PI INDICATOR 7.0 (GLOVE) ×2
GLOVE ORTHO TXT STRL SZ7.5 (GLOVE) IMPLANT
GOWN STRL REUS W/ TWL LRG LVL3 (GOWN DISPOSABLE) ×1 IMPLANT
GOWN STRL REUS W/TWL 2XL LVL3 (GOWN DISPOSABLE) ×3 IMPLANT
GOWN STRL REUS W/TWL LRG LVL3 (GOWN DISPOSABLE) ×2
HANDPIECE INTERPULSE COAX TIP (DISPOSABLE) ×2
KIT BASIN OR (CUSTOM PROCEDURE TRAY) ×3 IMPLANT
KIT ROOM TURNOVER OR (KITS) ×3 IMPLANT
MANIFOLD NEPTUNE II (INSTRUMENTS) ×3 IMPLANT
NS IRRIG 1000ML POUR BTL (IV SOLUTION) ×3 IMPLANT
PACK ORTHO EXTREMITY (CUSTOM PROCEDURE TRAY) ×3 IMPLANT
PAD ARMBOARD 7.5X6 YLW CONV (MISCELLANEOUS) ×6 IMPLANT
PENCIL BUTTON HOLSTER BLD 10FT (ELECTRODE) IMPLANT
SET HNDPC FAN SPRY TIP SCT (DISPOSABLE) ×1 IMPLANT
SPONGE LAP 18X18 X RAY DECT (DISPOSABLE) IMPLANT
STOCKINETTE IMPERVIOUS 9X36 MD (GAUZE/BANDAGES/DRESSINGS) ×3 IMPLANT
SUT ETHILON 3 0 PS 1 (SUTURE) ×3 IMPLANT
TOWEL OR 17X24 6PK STRL BLUE (TOWEL DISPOSABLE) ×3 IMPLANT
TOWEL OR 17X26 10 PK STRL BLUE (TOWEL DISPOSABLE) ×3 IMPLANT
TOWEL OR NON WOVEN STRL DISP B (DISPOSABLE) IMPLANT
TUBE ANAEROBIC SPECIMEN COL (MISCELLANEOUS) IMPLANT
TUBE CONNECTING 12'X1/4 (SUCTIONS) ×1
TUBE CONNECTING 12X1/4 (SUCTIONS) ×2 IMPLANT
UNDERPAD 30X30 INCONTINENT (UNDERPADS AND DIAPERS) ×3 IMPLANT
WATER STERILE IRR 1000ML POUR (IV SOLUTION) ×3 IMPLANT
YANKAUER SUCT BULB TIP NO VENT (SUCTIONS) ×3 IMPLANT

## 2014-09-13 NOTE — Progress Notes (Signed)
IV removed per order. Seen by OT and PT prior to discharge. Discharge instructions explained with teach back. Discharged via wheelchair to wife's care with NT present. Trina Aoarla Denario Bagot, RN
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
## 2014-09-13 NOTE — Op Note (Signed)
09/12/2014 - 09/13/2014  9:58 AM  PATIENT:  Ricky Berger    PRE-OPERATIVE DIAGNOSIS:  WOUND INFECTION  POST-OPERATIVE DIAGNOSIS:  Same  PROCEDURE:  IRRIGATION AND DEBRIDEMENT OF LEG  SURGEON:  Kiley Torrence, Jewel BaizeIMOTHY D, MD  ASSISTANT: Janalee DaneBrittney Kelly, PA-C, She was present and scrubbed throughout the case, critical for completion in a timely fashion, and for retraction, instrumentation, and closure.   ANESTHESIA:   gen  PREOPERATIVE INDICATIONS:  Ricky RochChristopher Herbig is a  34 y.o. male with a diagnosis of WOUND INFECTION who failed conservative measures and elected for surgical management.    The risks benefits and alternatives were discussed with the patient preoperatively including but not limited to the risks of infection, bleeding, nerve injury, cardiopulmonary complications, the need for revision surgery, among others, and the patient was willing to proceed.  OPERATIVE IMPLANTS: none  OPERATIVE FINDINGS: 3 foreign bodies  BLOOD LOSS: 20  COMPLICATIONS: none  TOURNIQUET TIME: none  OPERATIVE PROCEDURE:  Patient was identified in the preoperative holding area and site was marked by me He was transported to the operating theater and placed on the table in supine position taking care to pad all bony prominences. After a preincinduction time out anesthesia was induced. The left lower extremity was prepped and draped in normal sterile fashion and a pre-incision timeout was performed. He received ancef after culture obtained for preoperative antibiotics.   He had a roughly 3 cm laceration on his left leg. He was draining purulent fluid. I extended this proximally and distally another 2 cm on either side.  I then explored his wound I took tissue cultures and sent those to the lab for culture.  I found 3 large wooden foreign bodies and some smaller pieces these were all removed. I then took a fluoroscopic x-ray to confirm no other radiopaque foreign bodies.  I then irrigated  his wound with 6 L of saline. I explored it again and found no additional foreign bodies. I closed the surgical incisions but allow the traumatic wound to be slightly open for draining. A large sterile dressing was applied and he was taking the PACU in stable condition.  POST OPERATIVE PLAN: WBAT, ambulate for dvt px, Bactrim and follow clutures     This note was generated using a template and dragon dictation system. In light of that, I have reviewed the note and all aspects of it are applicable to this case. Any dictation errors are due to the computerized dictation system.

## 2014-09-13 NOTE — Progress Notes (Signed)
UR completed 

## 2014-09-13 NOTE — Progress Notes (Signed)
Orthopedic Tech Progress Note Patient Details:  Ricky RochChristopher Berger 1980/10/12 [id number] Fit pt. for crutches and taught use of same. Ortho Devices Type of Ortho Device: Crutches Ortho Device/Splint Interventions: Application, Adjustment   Lesle ChrisGilliland, Safiyah Cisney L 09/13/2014, 4:04 PM
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
## 2014-09-13 NOTE — Transfer of Care (Signed)
Immediate Anesthesia Transfer of Care Note  Patient: Delcie RochChristopher Howell  Procedure(s) Performed: Procedure(s): IRRIGATION AND DEBRIDEMENT OF LEG (Left)  Patient Location: PACU  Anesthesia Type:General  Level of Consciousness: awake, alert  and oriented  Airway & Oxygen Therapy: Patient Spontanous Breathing and Patient connected to nasal cannula oxygen  Post-op Assessment: Report given to RN and Post -op Vital signs reviewed and stable  Post vital signs: Reviewed and stable  Last Vitals:  Filed Vitals:   09/13/14 0645  BP:   Pulse:   Temp:   Resp: 17    Complications: No apparent anesthesia complications

## 2014-09-13 NOTE — Discharge Instructions (Signed)
Keep dressing clean and dry till follow up  Weight bearing as tolerated in the left leg  Continue antibiotics until completion to prevent wound infection

## 2014-09-13 NOTE — Anesthesia Procedure Notes (Signed)
Procedure Name: LMA Insertion Date/Time: 09/13/2014 9:08 AM Performed by: Gwenyth AllegraADAMI, Ricky Fera Pre-anesthesia Checklist: Patient identified, Emergency Drugs available, Suction available, Patient being monitored and Timeout performed Patient Re-evaluated:Patient Re-evaluated prior to inductionOxygen Delivery Method: Circle system utilized Preoxygenation: Pre-oxygenation with 100% oxygen Intubation Type: IV induction LMA: LMA inserted LMA Size: 5.0 Placement Confirmation: positive ETCO2 and breath sounds checked- equal and bilateral Tube secured with: Tape Dental Injury: Teeth and Oropharynx as per pre-operative assessment

## 2014-09-13 NOTE — Anesthesia Postprocedure Evaluation (Signed)
Anesthesia Post Note  Patient: Ricky Berger  Procedure(s) Performed: Procedure(s) (LRB): IRRIGATION AND DEBRIDEMENT OF LEG (Left)  Anesthesia type: General  Patient location: PACU  Post pain: Pain level controlled and Adequate analgesia  Post assessment: Post-op Vital signs reviewed, Patient's Cardiovascular Status Stable, Respiratory Function Stable, Patent Airway and Pain level controlled  Last Vitals:  Filed Vitals:   09/13/14 0957  BP:   Pulse:   Temp: 36.7 C  Resp:     Post vital signs: Reviewed and stable  Level of consciousness: awake, alert  and oriented  Complications: No apparent anesthesia complications
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
## 2014-09-13 NOTE — Anesthesia Preprocedure Evaluation (Addendum)
Anesthesia Evaluation  Patient identified by MRN, date of birth, ID band Patient awake    Reviewed: Allergy & Precautions, NPO status , Patient's Chart, lab work & pertinent test results  Airway Mallampati: II   Neck ROM: full    Dental   Pulmonary former smoker,    breath sounds clear to auscultation       Cardiovascular negative cardio ROS   Rhythm:regular Rate:Normal     Neuro/Psych    GI/Hepatic   Endo/Other    Renal/GU      Musculoskeletal   Abdominal   Peds  Hematology   Anesthesia Other Findings   Reproductive/Obstetrics                            Anesthesia Physical Anesthesia Plan  ASA: I  Anesthesia Plan: General   Post-op Pain Management:    Induction: Intravenous  Airway Management Planned: LMA  Additional Equipment:   Intra-op Plan:   Post-operative Plan:   Informed Consent: I have reviewed the patients History and Physical, chart, labs and discussed the procedure including the risks, benefits and alternatives for the proposed anesthesia with the patient or authorized representative who has indicated his/her understanding and acceptance.     Plan Discussed with: CRNA, Anesthesiologist and Surgeon  Anesthesia Plan Comments:         Anesthesia Quick Evaluation  

## 2014-09-14 LAB — WOUND CULTURE
Culture: NO GROWTH
Gram Stain: NONE SEEN

## 2014-09-15 ENCOUNTER — Encounter (HOSPITAL_COMMUNITY): Payer: Self-pay | Admitting: Orthopedic Surgery

## 2014-09-16 LAB — TISSUE CULTURE

## 2014-09-18 LAB — ANAEROBIC CULTURE

## 2019-01-19 ENCOUNTER — Encounter (HOSPITAL_COMMUNITY): Payer: Self-pay | Admitting: Emergency Medicine

## 2019-01-19 ENCOUNTER — Emergency Department (HOSPITAL_COMMUNITY)
Admission: EM | Admit: 2019-01-19 | Discharge: 2019-01-19 | Disposition: A | Discharge status: Home / Self Care | Payer: BLUE CROSS/BLUE SHIELD | Attending: Emergency Medicine | Admitting: Emergency Medicine

## 2019-01-19 ENCOUNTER — Other Ambulatory Visit: Payer: Self-pay

## 2019-01-19 ENCOUNTER — Emergency Department (HOSPITAL_COMMUNITY): Payer: BLUE CROSS/BLUE SHIELD

## 2019-01-19 DIAGNOSIS — Z87891 Personal history of nicotine dependence: Secondary | ICD-10-CM | POA: Insufficient documentation

## 2019-01-19 DIAGNOSIS — S3992XA Unspecified injury of lower back, initial encounter: Secondary | ICD-10-CM | POA: Diagnosis present

## 2019-01-19 DIAGNOSIS — S39012A Strain of muscle, fascia and tendon of lower back, initial encounter: Secondary | ICD-10-CM | POA: Insufficient documentation

## 2019-01-19 DIAGNOSIS — Y999 Unspecified external cause status: Secondary | ICD-10-CM | POA: Diagnosis not present

## 2019-01-19 DIAGNOSIS — Z79899 Other long term (current) drug therapy: Secondary | ICD-10-CM | POA: Diagnosis not present

## 2019-01-19 DIAGNOSIS — Y929 Unspecified place or not applicable: Secondary | ICD-10-CM | POA: Insufficient documentation

## 2019-01-19 DIAGNOSIS — Y9351 Activity, roller skating (inline) and skateboarding: Secondary | ICD-10-CM | POA: Diagnosis not present

## 2019-01-19 MED ORDER — OXYCODONE-ACETAMINOPHEN 5-325 MG PO TABS: 1.0000 | ORAL_TABLET | ORAL | 0 refills | Status: DC | PRN

## 2019-01-19 MED ORDER — DIAZEPAM 5 MG PO TABS: 5.0000 mg | ORAL_TABLET | Freq: Four times a day (QID) | ORAL | 0 refills | Status: DC | PRN

## 2019-01-19 MED ORDER — ONDANSETRON HCL 4 MG/2ML IJ SOLN
4.0000 mg | Freq: Once | INTRAMUSCULAR | Status: AC
  Administered 2019-01-19: 4 mg via INTRAVENOUS
  Filled 2019-01-19: qty 2

## 2019-01-19 MED ORDER — HYDROMORPHONE HCL 1 MG/ML IJ SOLN
1.0000 mg | Freq: Once | INTRAMUSCULAR | Status: AC
  Administered 2019-01-19: 05:00:00 1 mg via INTRAVENOUS
  Filled 2019-01-19: qty 1

## 2019-01-19 MED ORDER — HYDROMORPHONE HCL 1 MG/ML IJ SOLN
1.0000 mg | Freq: Once | INTRAMUSCULAR | Status: AC
  Administered 2019-01-19: 04:00:00 1 mg via INTRAVENOUS
  Filled 2019-01-19: qty 1

## 2019-01-19 MED ORDER — KETOROLAC TROMETHAMINE 30 MG/ML IJ SOLN
30.0000 mg | Freq: Once | INTRAMUSCULAR | Status: AC
  Administered 2019-01-19: 05:00:00 30 mg via INTRAVENOUS
  Filled 2019-01-19: qty 1
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
## 2019-01-19 NOTE — ED Notes (Signed)
Pt verbalized understanding of discharge paperwork, prescriptions and follow-up care 

## 2019-01-19 NOTE — ED Triage Notes (Signed)
Patient with lower back pain after falling this morning skateboarding.  Patient took some ibuprofen and and pain med earlier in the day.  He states that the pain is more than he has ever had.  He states that he fell directly on his butt on concrete.

## 2020-02-12 IMAGING — CR SACRUM AND COCCYX - 2+ VIEW
5 series · 5 of 5 positions shown · non-contrast
Comparison: None.

CLINICAL DATA: Fall with pain

EXAM:
SACRUM AND COCCYX - 2+ VIEW

[coccyx ap (1 of 3)]
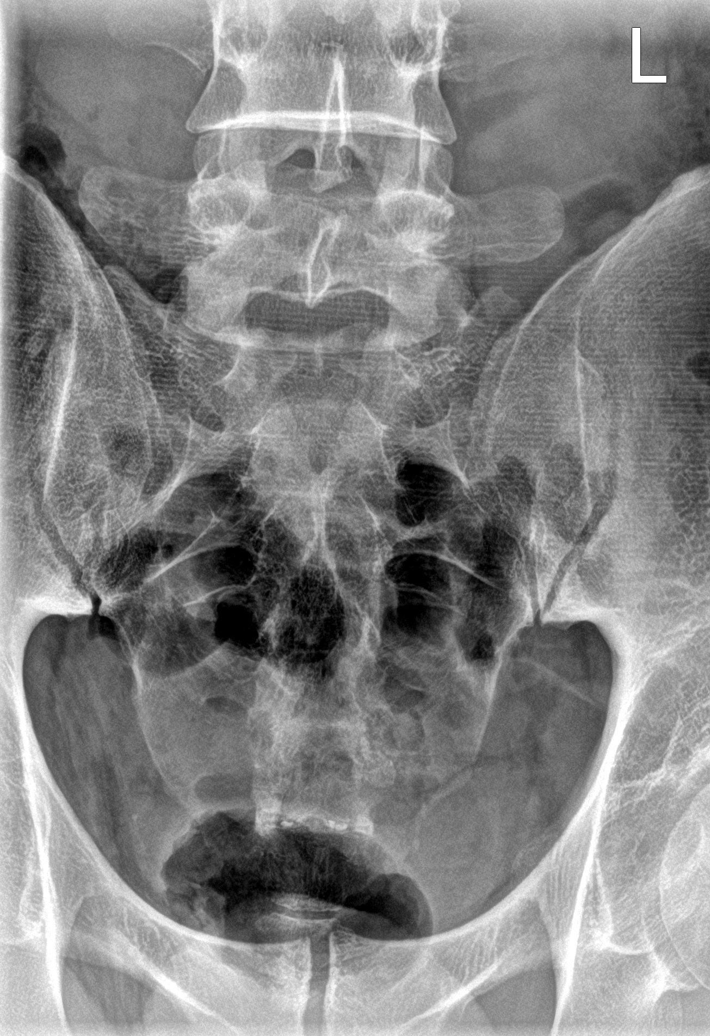

[sacrum lat (1 of 2)]
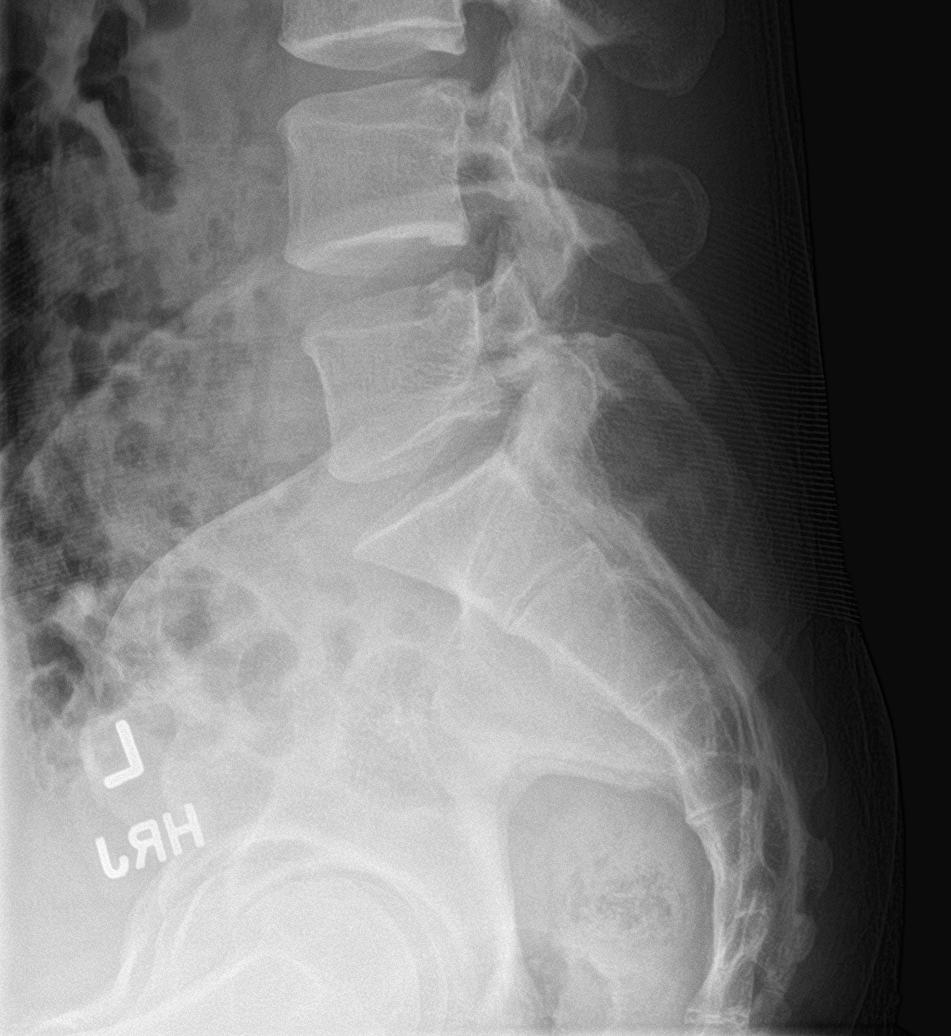

[coccyx ap (2 of 3)]
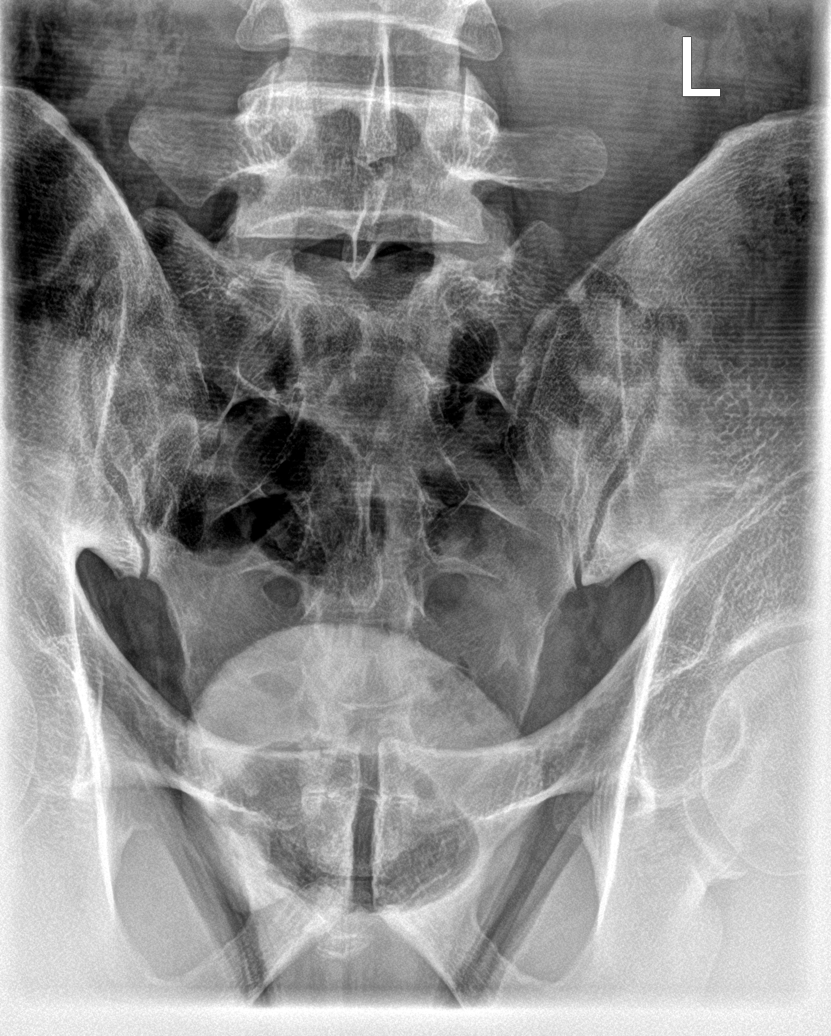

[coccyx ap (3 of 3)]
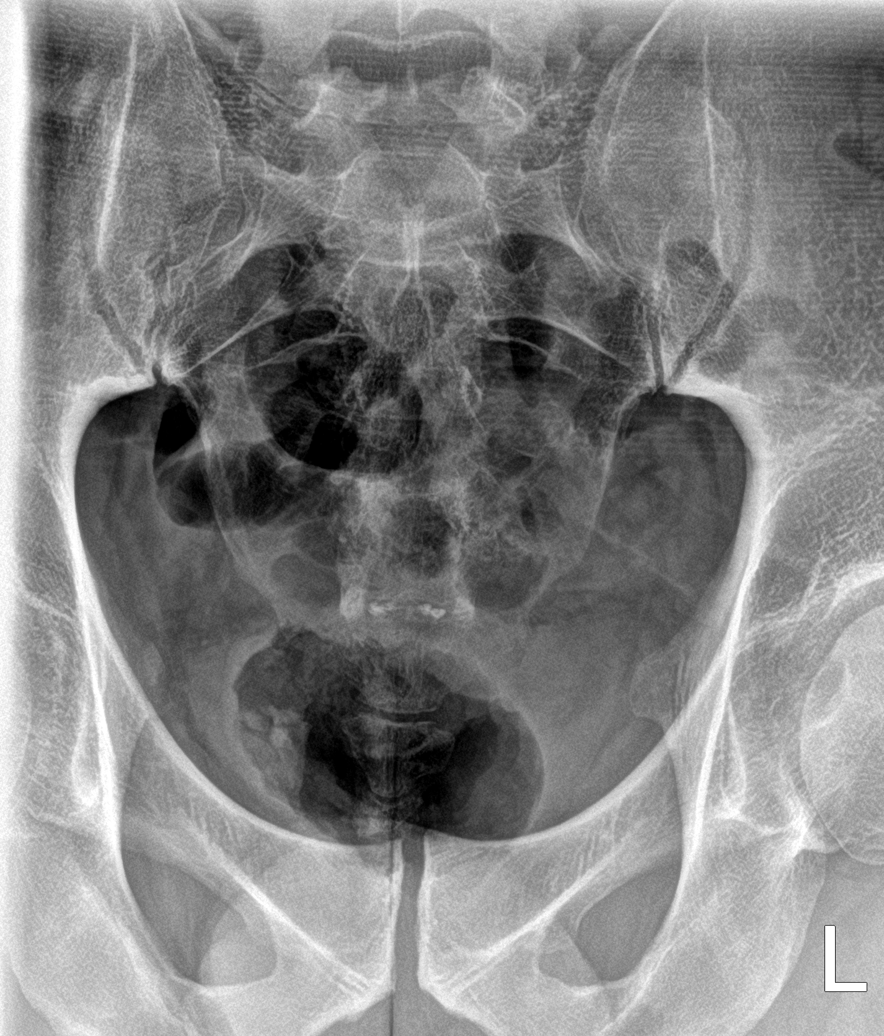

[sacrum lat (2 of 2)]
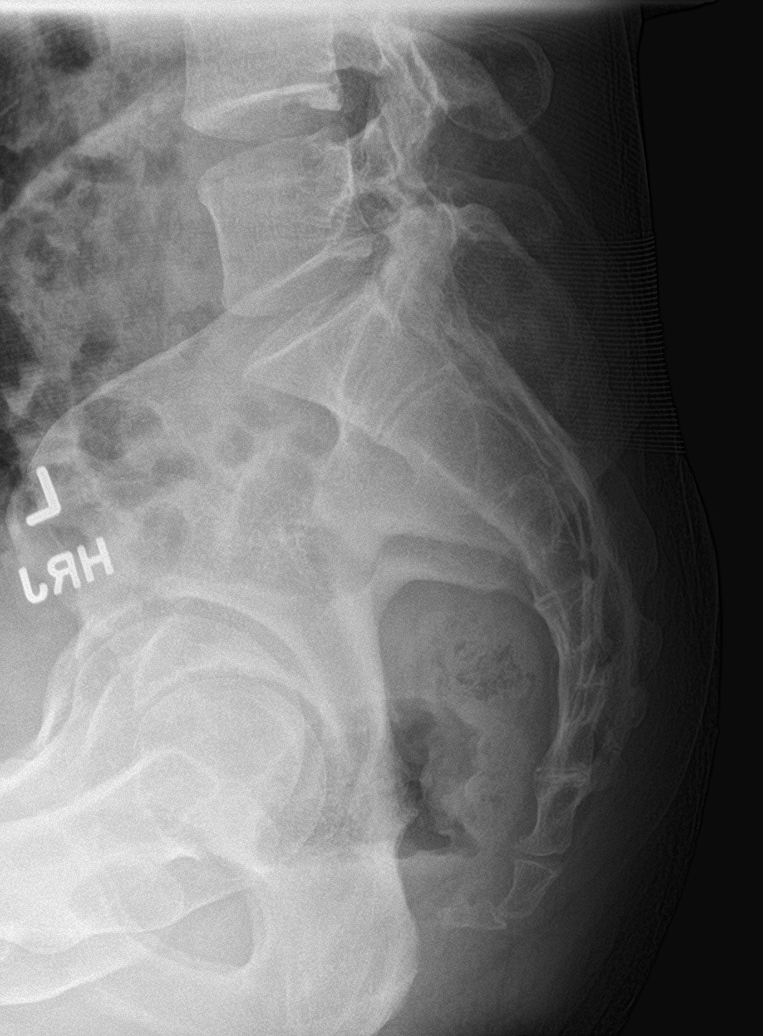

[5 of 5 positions shown; findings below may reference images not displayed]

FINDINGS: There is no evidence of fracture or other focal bone lesions.
IMPRESSION: Negative.

## 2022-09-04 ENCOUNTER — Ambulatory Visit: Payer: BLUE CROSS/BLUE SHIELD | Admitting: Family Medicine

## 2022-09-04 NOTE — Progress Notes (Deleted)
  [name] [address] [place] [address] Phone: [phone] Subjective:    I'm seeing this patient by the request  of:  Patient, No Pcp Per  CC: back pain follow up   NTI:[id number]  [facility] is a 42 y.o. male coming in with complaint of L sided back pain. Patient states   Xrays normal 2020    No past medical history on file. Past Surgical History:  Procedure Laterality Date   I & D EXTREMITY Left 09/13/2014   Procedure: IRRIGATION AND DEBRIDEMENT OF LEG;  Surgeon: Sheral Apley, MD;  Location: [facility];  Service: Orthopedics;  Laterality: Left;   SHOULDER SURGERY     Social History   Socioeconomic History   Marital status: Married    Spouse name: Not on file   Number of children: Not on file   Years of education: Not on file   Highest education level: Not on file  Occupational History   Not on file  Tobacco Use   Smoking status: Former   Smokeless tobacco: Never  Substance and Sexual Activity   Alcohol use: Yes   Drug use: No   Sexual activity: Not on file  Other Topics Concern   Not on file  Social History Narrative   Not on file   Social Determinants of Health   Financial Resource Strain: Not on file  Food Insecurity: Not on file  Transportation Needs: Not on file  Physical Activity: Not on file  Stress: Not on file  Social Connections: Not on file   No Known Allergies No family history on file.     Current Outpatient Medications (Analgesics):    oxyCODONE-acetaminophen (PERCOCET) 5-325 MG tablet, Take 1-2 tablets by mouth every 4 (four) hours as needed.   Current Outpatient Medications (Other):    diazepam (VALIUM) 5 MG tablet, Take 1 tablet (5 mg total) by mouth every 6 (six) hours as needed for muscle spasms.   sulfamethoxazole-trimethoprim (BACTRIM DS,SEPTRA DS) 800-160 MG per tablet, Take 2 tablets by mouth every 12 (twelve) hours.   Reviewed prior external information including notes  and imaging from  primary care provider As well as notes that were available from care everywhere and other healthcare systems.  Past medical history, social, surgical and family history all reviewed in electronic medical record.  No pertanent information unless stated regarding to the chief complaint.   Review of Systems:  No headache, visual changes, nausea, vomiting, diarrhea, constipation, dizziness, abdominal pain, skin rash, fevers, chills, night sweats, weight loss, swollen lymph nodes, body aches, joint swelling, chest pain, shortness of breath, mood changes. POSITIVE muscle aches  Objective  There were no vitals taken for this visit.   General: No apparent distress alert and oriented x3 mood and affect normal, dressed appropriately.  HEENT: Pupils equal, extraocular movements intact  Respiratory: Patient's speak in full sentences and does not appear short of breath  Cardiovascular: No lower extremity edema, non tender, no erythema      Impression and Recommendations:     The above documentation has been reviewed and is accurate and complete Judi Saa, DO

## 2023-10-31 ENCOUNTER — Other Ambulatory Visit: Payer: Self-pay | Admitting: Family Medicine

## 2023-10-31 DIAGNOSIS — E236 Other disorders of pituitary gland: Secondary | ICD-10-CM

## 2024-03-20 ENCOUNTER — Inpatient Hospital Stay
Admission: RE | Admit: 2024-03-20 | Discharge: 2024-03-20 | Payer: Self-pay | Attending: Family Medicine | Admitting: Family Medicine

## 2024-03-20 DIAGNOSIS — E236 Other disorders of pituitary gland: Secondary | ICD-10-CM

## 2024-03-20 MED ORDER — GADOPICLENOL 0.5 MMOL/ML IV SOLN
9.0000 mL | Freq: Once | INTRAVENOUS | Status: AC | PRN
  Administered 2024-03-20: 9 mL via INTRAVENOUS

## 2024-04-12 ENCOUNTER — Emergency Department (HOSPITAL_COMMUNITY)
Admission: EM | Admit: 2024-04-12 | Discharge: 2024-04-12 | Disposition: A | Discharge status: Home / Self Care | Attending: Emergency Medicine | Admitting: Emergency Medicine

## 2024-04-12 ENCOUNTER — Emergency Department (HOSPITAL_COMMUNITY)

## 2024-04-12 ENCOUNTER — Other Ambulatory Visit: Payer: Self-pay

## 2024-04-12 DIAGNOSIS — Y9355 Activity, bike riding: Secondary | ICD-10-CM | POA: Insufficient documentation

## 2024-04-12 DIAGNOSIS — S42022A Displaced fracture of shaft of left clavicle, initial encounter for closed fracture: Secondary | ICD-10-CM | POA: Diagnosis not present

## 2024-04-12 DIAGNOSIS — S4992XA Unspecified injury of left shoulder and upper arm, initial encounter: Secondary | ICD-10-CM | POA: Diagnosis present

## 2024-04-12 MED ORDER — OXYCODONE-ACETAMINOPHEN 5-325 MG PO TABS: 1.0000 | ORAL_TABLET | Freq: Four times a day (QID) | ORAL | 0 refills | Status: DC | PRN

## 2024-04-12 MED ORDER — OXYCODONE-ACETAMINOPHEN 5-325 MG PO TABS
1.0000 | ORAL_TABLET | Freq: Once | ORAL | Status: AC
  Administered 2024-04-12: 1 via ORAL
  Filled 2024-04-12: qty 1

## 2024-04-12 MED ORDER — ONDANSETRON 4 MG PO TBDP: 4.0000 mg | ORAL_TABLET | Freq: Three times a day (TID) | ORAL | 0 refills | Status: AC | PRN

## 2024-04-12 MED ORDER — ONDANSETRON 4 MG PO TBDP
4.0000 mg | ORAL_TABLET | Freq: Once | ORAL | Status: AC
  Administered 2024-04-12: 4 mg via ORAL
  Filled 2024-04-12: qty 1
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
## 2024-04-12 NOTE — ED Notes (Signed)
 Pt coming from X-Ray. Wife already sent back to the bed.

## 2024-04-12 NOTE — Progress Notes (Signed)
 Orthopedic Tech Progress Note Patient Details:  [facility] 21-Oct-1980 [id number] Applied sling immobilizer per order.  Ortho Devices Type of Ortho Device: Sling immobilizer Ortho Device/Splint Location: LUE Ortho Device/Splint Interventions: Ordered, Application, Adjustment   Post Interventions Patient Tolerated: Well Instructions Provided: Adjustment of device, Care of device, Poper ambulation with device  Morna Pink 04/12/2024, 7:06 PM
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
## 2024-04-12 NOTE — Discharge Instructions (Signed)
 As we discussed, you can be discharged home and should call Dr. Sharl' office to schedule in-office follow up for next week. Use the immobilizer to stablize the fracture. You can remove it to bathe/shower. Take Percocet for pain and Zofran  for any nausea.   If pain is uncontrolled, you have uncontrolled nausea/vomiting, or new symptom of concern, please return to the ED for further management.
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
## 2024-04-15 ENCOUNTER — Telehealth: Payer: Self-pay

## 2024-04-15 ENCOUNTER — Telehealth: Payer: Self-pay | Admitting: Orthopedic Surgery

## 2024-04-15 NOTE — Telephone Encounter (Signed)
 Diane G., patients mother-in-law prefers Dr. Addie.  Will anytime tomorrow be okay?

## 2024-04-15 NOTE — Telephone Encounter (Signed)
 Looked at [facility].  Looks like it needs surgery. Patient is currently scheduled for surgery with Dr Teresa on 12/4. If they prefer Dr Addie do the surgery, would probably need to see him tomorrow

## 2024-04-15 NOTE — Telephone Encounter (Signed)
 Patients mother in law Diane G. Would like to know if you or Dr. Addie could look at patients X-ray of Left Clavicle Fx. She is requesting an appt.for patient to be seen.  CB# for Diane G.is [phone].  Please advise.  Thank you.

## 2024-04-15 NOTE — Telephone Encounter (Signed)
Patient scheduled.    Thank you!

## 2024-04-15 NOTE — Telephone Encounter (Signed)
 Thanks for looking at it.  I called and left message on machine.  Agree that it needs fixation

## 2024-04-16 ENCOUNTER — Ambulatory Visit (INDEPENDENT_AMBULATORY_CARE_PROVIDER_SITE_OTHER): Admitting: Orthopedic Surgery

## 2024-04-16 ENCOUNTER — Encounter: Payer: Self-pay | Admitting: Orthopedic Surgery

## 2024-04-16 DIAGNOSIS — S42022A Displaced fracture of shaft of left clavicle, initial encounter for closed fracture: Secondary | ICD-10-CM | POA: Diagnosis not present

## 2024-04-16 MED ORDER — METHOCARBAMOL 500 MG PO TABS: 500.0000 mg | ORAL_TABLET | Freq: Three times a day (TID) | ORAL | 0 refills | Status: AC | PRN

## 2024-04-16 MED ORDER — OXYCODONE HCL 5 MG PO CAPS: 5.0000 mg | ORAL_CAPSULE | ORAL | 0 refills | Status: DC | PRN

## 2024-04-16 MED ORDER — ONDANSETRON HCL 4 MG PO TABS: 4.0000 mg | ORAL_TABLET | Freq: Three times a day (TID) | ORAL | 0 refills | Status: AC | PRN
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
## 2024-04-16 NOTE — Progress Notes (Signed)
 Sent message, via epic in basket, requesting orders in epic from Careers adviser.

## 2024-04-18 ENCOUNTER — Other Ambulatory Visit: Payer: Self-pay

## 2024-04-18 ENCOUNTER — Encounter (HOSPITAL_COMMUNITY): Payer: Self-pay | Admitting: Orthopedic Surgery

## 2024-04-18 NOTE — Progress Notes (Signed)
 SDW call  Patient was given pre-op instructions over the phone. Patient verbalized understanding of instructions provided.     PCP - Dr. Elfrieda Haggard Cardiologist -  Pulmonary:    PPM/ICD - denies Device Orders - na Rep Notified - na   Chest x-ray - na EKG -  na Stress Test - ECHO -  Cardiac Cath -   Sleep Study/sleep apnea/CPAP: denies  Non-diabetic  Blood Thinner Instructions: denies Aspirin Instructions:denies   ERAS Protcol - Clears until 0430  Anesthesia review: No   Patient denies shortness of breath, fever, cough and chest pain over the phone call  Your procedure is scheduled on Monday April 21, 2024  Report to [facility] Main Entrance A at  0530  A.M., then check in with the Admitting office.  Call this number if you have problems the morning of surgery:  [phone]   If you have any questions prior to your surgery date call [phone]: Open Monday-Friday 8am-4pm If you experience any cold or flu symptoms such as cough, fever, chills, shortness of breath, etc. between now and your scheduled surgery, please notify us  at the above number     Remember:  Do not eat after midnight the night before your surgery  You may drink clear liquids until  0430   the morning of your surgery.   Clear liquids allowed are: Water, Non-Citrus Juices (without pulp), Carbonated Beverages, Clear Tea, Black Coffee ONLY (NO MILK, CREAM OR POWDERED CREAMER of any kind), and Gatorade   Take these medicines if needed the morning of surgery with A SIP OF WATER:  Robaxin , zofran , oxycodone   As of today, STOP taking any Aspirin (unless otherwise instructed by your surgeon) Aleve, Naproxen, Ibuprofen, Motrin, Advil, Goody's, BC's, all herbal medications, fish oil, and all vitamins.

## 2024-04-20 NOTE — Anesthesia Preprocedure Evaluation (Signed)
 Anesthesia Evaluation  Patient identified by MRN, date of birth, ID band Patient awake    Reviewed: Allergy & Precautions, NPO status , Patient's Chart, lab work & pertinent test results  History of Anesthesia Complications (+) PONV and history of anesthetic complications  Airway Mallampati: II  TM Distance: >3 FB Neck ROM: Full    Dental  (+) Teeth Intact, Dental Advisory Given   Pulmonary former smoker   Pulmonary exam normal breath sounds clear to auscultation       Cardiovascular negative cardio ROS Normal cardiovascular exam Rhythm:Regular Rate:Normal     Neuro/Psych negative neurological ROS  negative psych ROS   GI/Hepatic negative GI ROS,,,(+)     substance abuse  marijuana use  Endo/Other  negative endocrine ROS    Renal/GU negative Renal ROS  negative genitourinary   Musculoskeletal negative musculoskeletal ROS (+)    Abdominal   Peds  Hematology negative hematology ROS (+)   Anesthesia Other Findings   Reproductive/Obstetrics                              Anesthesia Physical Anesthesia Plan  ASA: 2  Anesthesia Plan: General   Post-op Pain Management: Tylenol  PO (pre-op)* and Precedex   Induction: Intravenous  PONV Risk Score and Plan: 3 and Midazolam , Dexamethasone, Ondansetron , Diphenhydramine and Scopolamine patch - Pre-op  Airway Management Planned: Oral ETT  Additional Equipment:   Intra-op Plan:   Post-operative Plan: Extubation in OR  Informed Consent: I have reviewed the patients History and Physical, chart, labs and discussed the procedure including the risks, benefits and alternatives for the proposed anesthesia with the patient or authorized representative who has indicated his/her understanding and acceptance.     Dental advisory given  Plan Discussed with: CRNA  Anesthesia Plan Comments:          Anesthesia Quick Evaluation

## 2024-04-21 ENCOUNTER — Ambulatory Visit (HOSPITAL_COMMUNITY): Payer: Self-pay | Admitting: Anesthesiology

## 2024-04-21 ENCOUNTER — Ambulatory Visit (HOSPITAL_COMMUNITY)

## 2024-04-21 ENCOUNTER — Encounter (HOSPITAL_COMMUNITY)
Admission: RE | Disposition: A | Discharge status: Home / Self Care | Payer: Self-pay | Source: Home / Self Care | Attending: Orthopedic Surgery

## 2024-04-21 ENCOUNTER — Other Ambulatory Visit: Payer: Self-pay | Admitting: Surgical

## 2024-04-21 ENCOUNTER — Ambulatory Visit (HOSPITAL_COMMUNITY)
Admission: RE | Admit: 2024-04-21 | Discharge: 2024-04-21 | Disposition: A | Attending: Orthopedic Surgery | Admitting: Orthopedic Surgery

## 2024-04-21 ENCOUNTER — Encounter (HOSPITAL_COMMUNITY)
Admission: RE | Admit: 2024-04-21 | Discharge: 2024-04-21 | Disposition: A | Discharge status: Home / Self Care | Source: Ambulatory Visit | Attending: Family Medicine | Admitting: Family Medicine

## 2024-04-21 DIAGNOSIS — S42002A Fracture of unspecified part of left clavicle, initial encounter for closed fracture: Secondary | ICD-10-CM

## 2024-04-21 DIAGNOSIS — Z87891 Personal history of nicotine dependence: Secondary | ICD-10-CM | POA: Insufficient documentation

## 2024-04-21 DIAGNOSIS — Z01818 Encounter for other preprocedural examination: Secondary | ICD-10-CM

## 2024-04-21 HISTORY — DX: Nausea with vomiting, unspecified: R11.2

## 2024-04-21 HISTORY — PX: ORIF CLAVICULAR FRACTURE: SHX5055

## 2024-04-21 HISTORY — DX: Family history of other specified conditions: Z84.89

## 2024-04-21 HISTORY — DX: Other complications of anesthesia, initial encounter: T88.59XA

## 2024-04-21 LAB — BASIC METABOLIC PANEL WITH GFR
Anion gap: 8 (ref 5–15)
BUN: 15 mg/dL (ref 6–20)
CO2: 28 mmol/L (ref 22–32)
Calcium: 8.5 mg/dL — ABNORMAL LOW (ref 8.9–10.3)
Chloride: 104 mmol/L (ref 98–111)
Creatinine, Ser: 1.13 mg/dL (ref 0.61–1.24)
GFR, Estimated: >60 mL/min (ref >60)
Glucose, Bld: 94 mg/dL (ref 70–99)
Potassium: 3.9 mmol/L (ref 3.5–5.1)
Sodium: 140 mmol/L (ref 135–145)

## 2024-04-21 LAB — CBC
HCT: 40.5 % (ref 39.0–52.0)
Hemoglobin: 13.5 g/dL (ref 13.0–17.0)
MCH: 30.7 pg (ref 26.0–34.0)
MCHC: 33.3 g/dL (ref 30.0–36.0)
MCV: 92 fL (ref 80.0–100.0)
Platelets: 158 K/uL (ref 150–400)
RBC: 4.4 MIL/uL (ref 4.22–5.81)
RDW: 12.8 % (ref 11.5–15.5)
WBC: 5.6 K/uL (ref 4.0–10.5)
nRBC: 0 % (ref 0.0–0.2)

## 2024-04-21 SURGERY — OPEN REDUCTION INTERNAL FIXATION (ORIF) CLAVICULAR FRACTURE
Anesthesia: General | Site: Shoulder | Laterality: Left

## 2024-04-21 MED ORDER — CHLORHEXIDINE GLUCONATE 0.12 % MT SOLN: 15.0000 mL | Freq: Once | OROMUCOSAL | Status: AC

## 2024-04-21 MED ORDER — SCOPOLAMINE 1 MG/3DAYS TD PT72
1.0000 | MEDICATED_PATCH | TRANSDERMAL | Status: DC
  Administered 2024-04-21: 1 mg via TRANSDERMAL
  Filled 2024-04-21: qty 1

## 2024-04-21 MED ORDER — AMISULPRIDE (ANTIEMETIC) 5 MG/2ML IV SOLN: 10.0000 mg | Freq: Once | INTRAVENOUS | Status: DC | PRN

## 2024-04-21 MED ORDER — OXYCODONE HCL 5 MG/5ML PO SOLN: 5.0000 mg | Freq: Once | ORAL | Status: AC | PRN

## 2024-04-21 MED ORDER — CLONIDINE HCL (ANALGESIA) 100 MCG/ML EP SOLN
EPIDURAL | Status: AC
  Filled 2024-04-21: qty 10

## 2024-04-21 MED ORDER — FENTANYL CITRATE (PF) 100 MCG/2ML IJ SOLN
25.0000 ug | INTRAMUSCULAR | Status: DC | PRN
  Administered 2024-04-21 (×2): 50 ug via INTRAVENOUS

## 2024-04-21 MED ORDER — POVIDONE-IODINE 10 % EX SWAB
2.0000 | Freq: Once | CUTANEOUS | Status: AC
  Administered 2024-04-21: 2 via TOPICAL

## 2024-04-21 MED ORDER — ROCURONIUM BROMIDE 10 MG/ML (PF) SYRINGE
PREFILLED_SYRINGE | INTRAVENOUS | Status: DC | PRN
  Administered 2024-04-21: 20 mg via INTRAVENOUS
  Administered 2024-04-21: 30 mg via INTRAVENOUS
  Administered 2024-04-21: 10 mg via INTRAVENOUS
  Administered 2024-04-21: 20 mg via INTRAVENOUS
  Administered 2024-04-21: 50 mg via INTRAVENOUS

## 2024-04-21 MED ORDER — DEXMEDETOMIDINE HCL IN NACL 80 MCG/20ML IV SOLN
INTRAVENOUS | Status: DC | PRN
  Administered 2024-04-21 (×2): 8 ug via INTRAVENOUS

## 2024-04-21 MED ORDER — FENTANYL CITRATE (PF) 250 MCG/5ML IJ SOLN
INTRAMUSCULAR | Status: AC
  Filled 2024-04-21: qty 5

## 2024-04-21 MED ORDER — PROPOFOL 10 MG/ML IV BOLUS
INTRAVENOUS | Status: AC
  Filled 2024-04-21: qty 20

## 2024-04-21 MED ORDER — DIPHENHYDRAMINE HCL 50 MG/ML IJ SOLN
INTRAMUSCULAR | Status: DC | PRN
  Administered 2024-04-21: 12.5 mg via INTRAVENOUS

## 2024-04-21 MED ORDER — MIDAZOLAM HCL (PF) 2 MG/2ML IJ SOLN
INTRAMUSCULAR | Status: DC | PRN
  Administered 2024-04-21: 2 mg via INTRAVENOUS

## 2024-04-21 MED ORDER — OXYCODONE HCL 5 MG PO TABS: 5.0000 mg | ORAL_TABLET | ORAL | 0 refills | Status: AC | PRN

## 2024-04-21 MED ORDER — CEFAZOLIN SODIUM-DEXTROSE 2-4 GM/100ML-% IV SOLN
INTRAVENOUS | Status: AC
  Filled 2024-04-21: qty 100

## 2024-04-21 MED ORDER — ORAL CARE MOUTH RINSE: 15.0000 mL | Freq: Once | OROMUCOSAL | Status: AC

## 2024-04-21 MED ORDER — POVIDONE-IODINE 7.5 % EX SOLN
Freq: Once | CUTANEOUS | Status: AC
  Filled 2024-04-21: qty 118

## 2024-04-21 MED ORDER — BUPIVACAINE HCL (PF) 0.5 % IJ SOLN
INTRAMUSCULAR | Status: AC
  Filled 2024-04-21: qty 10

## 2024-04-21 MED ORDER — MORPHINE SULFATE (PF) 4 MG/ML IV SOLN
INTRAVENOUS | Status: DC | PRN
  Administered 2024-04-21: 33 mL

## 2024-04-21 MED ORDER — HYDROMORPHONE HCL 1 MG/ML IJ SOLN
0.2500 mg | INTRAMUSCULAR | Status: DC | PRN
  Administered 2024-04-21 (×2): 0.5 mg via INTRAVENOUS

## 2024-04-21 MED ORDER — ROCURONIUM BROMIDE 10 MG/ML (PF) SYRINGE
PREFILLED_SYRINGE | INTRAVENOUS | Status: AC
  Filled 2024-04-21: qty 10

## 2024-04-21 MED ORDER — OXYCODONE HCL 5 MG PO TABS
ORAL_TABLET | ORAL | Status: AC
  Filled 2024-04-21: qty 1

## 2024-04-21 MED ORDER — ONDANSETRON HCL 4 MG/2ML IJ SOLN
INTRAMUSCULAR | Status: AC
  Filled 2024-04-21: qty 2

## 2024-04-21 MED ORDER — KETAMINE HCL 10 MG/ML IJ SOLN
INTRAMUSCULAR | Status: DC | PRN
  Administered 2024-04-21: 30 mg via INTRAVENOUS
  Administered 2024-04-21: 20 mg via INTRAVENOUS

## 2024-04-21 MED ORDER — 0.9 % SODIUM CHLORIDE (POUR BTL) OPTIME
TOPICAL | Status: DC | PRN
  Administered 2024-04-21: 1000 mL

## 2024-04-21 MED ORDER — MIDAZOLAM HCL 2 MG/2ML IJ SOLN
INTRAMUSCULAR | Status: AC
  Filled 2024-04-21: qty 2

## 2024-04-21 MED ORDER — CHLORHEXIDINE GLUCONATE 0.12 % MT SOLN
OROMUCOSAL | Status: AC
  Administered 2024-04-21: 15 mL via OROMUCOSAL
  Filled 2024-04-21: qty 15

## 2024-04-21 MED ORDER — LIDOCAINE 2% (20 MG/ML) 5 ML SYRINGE
INTRAMUSCULAR | Status: DC | PRN
  Administered 2024-04-21: 60 mg via INTRAVENOUS

## 2024-04-21 MED ORDER — ACETAMINOPHEN 500 MG PO TABS
ORAL_TABLET | ORAL | Status: AC
  Administered 2024-04-21: 1000 mg via ORAL
  Filled 2024-04-21: qty 1

## 2024-04-21 MED ORDER — MORPHINE SULFATE (PF) 4 MG/ML IV SOLN
INTRAVENOUS | Status: AC
  Filled 2024-04-21: qty 2

## 2024-04-21 MED ORDER — VANCOMYCIN HCL 1000 MG IV SOLR
INTRAVENOUS | Status: AC
  Filled 2024-04-21: qty 20

## 2024-04-21 MED ORDER — VANCOMYCIN HCL 1000 MG IV SOLR
INTRAVENOUS | Status: DC | PRN
  Administered 2024-04-21: 1000 mg via TOPICAL

## 2024-04-21 MED ORDER — SUGAMMADEX SODIUM 200 MG/2ML IV SOLN
INTRAVENOUS | Status: DC | PRN
  Administered 2024-04-21: 200 mg via INTRAVENOUS

## 2024-04-21 MED ORDER — ACETAMINOPHEN 500 MG PO TABS: 1000.0000 mg | ORAL_TABLET | Freq: Once | ORAL | Status: AC

## 2024-04-21 MED ORDER — LACTATED RINGERS IV SOLN: INTRAVENOUS | Status: DC

## 2024-04-21 MED ORDER — MORPHINE SULFATE (PF) 4 MG/ML IV SOLN
INTRAVENOUS | Status: AC
  Filled 2024-04-21: qty 1

## 2024-04-21 MED ORDER — LIDOCAINE 2% (20 MG/ML) 5 ML SYRINGE
INTRAMUSCULAR | Status: AC
  Filled 2024-04-21: qty 5

## 2024-04-21 MED ORDER — FENTANYL CITRATE (PF) 250 MCG/5ML IJ SOLN
INTRAMUSCULAR | Status: DC | PRN
  Administered 2024-04-21: 50 ug via INTRAVENOUS
  Administered 2024-04-21: 100 ug via INTRAVENOUS

## 2024-04-21 MED ORDER — CEFAZOLIN SODIUM-DEXTROSE 2-4 GM/100ML-% IV SOLN
2.0000 g | INTRAVENOUS | Status: AC
  Administered 2024-04-21: 2 g via INTRAVENOUS

## 2024-04-21 MED ORDER — TRANEXAMIC ACID-NACL 1000-0.7 MG/100ML-% IV SOLN
1000.0000 mg | INTRAVENOUS | Status: AC
  Administered 2024-04-21: 1000 mg via INTRAVENOUS

## 2024-04-21 MED ORDER — TRANEXAMIC ACID-NACL 1000-0.7 MG/100ML-% IV SOLN
INTRAVENOUS | Status: AC
  Filled 2024-04-21: qty 100

## 2024-04-21 MED ORDER — DEXAMETHASONE SOD PHOSPHATE PF 10 MG/ML IJ SOLN
INTRAMUSCULAR | Status: DC | PRN
  Administered 2024-04-21: 10 mg via INTRAVENOUS

## 2024-04-21 MED ORDER — BUPIVACAINE HCL 0.5 % IJ SOLN
INTRAMUSCULAR | Status: AC
  Filled 2024-04-21: qty 1

## 2024-04-21 MED ORDER — KETAMINE HCL 50 MG/5ML IJ SOSY
PREFILLED_SYRINGE | INTRAMUSCULAR | Status: AC
  Filled 2024-04-21: qty 5

## 2024-04-21 MED ORDER — ONDANSETRON HCL 4 MG/2ML IJ SOLN
INTRAMUSCULAR | Status: DC | PRN
  Administered 2024-04-21: 4 mg via INTRAVENOUS

## 2024-04-21 MED ORDER — PROPOFOL 10 MG/ML IV BOLUS
INTRAVENOUS | Status: DC | PRN
  Administered 2024-04-21: 50 mg via INTRAVENOUS
  Administered 2024-04-21: 200 mg via INTRAVENOUS
  Administered 2024-04-21: 30 mg via INTRAVENOUS

## 2024-04-21 MED ORDER — CELECOXIB 100 MG PO CAPS: 100.0000 mg | ORAL_CAPSULE | Freq: Two times a day (BID) | ORAL | 0 refills | Status: DC

## 2024-04-21 MED ORDER — OXYCODONE HCL 5 MG PO TABS
5.0000 mg | ORAL_TABLET | Freq: Once | ORAL | Status: AC | PRN
  Administered 2024-04-21: 5 mg via ORAL

## 2024-04-21 MED ORDER — HYDROMORPHONE HCL 1 MG/ML IJ SOLN
INTRAMUSCULAR | Status: AC
  Filled 2024-04-21: qty 1

## 2024-04-21 MED ORDER — FENTANYL CITRATE (PF) 100 MCG/2ML IJ SOLN
INTRAMUSCULAR | Status: AC
  Filled 2024-04-21: qty 2

## 2024-04-21 SURGICAL SUPPLY — 57 items
BAG COUNTER SPONGE SURGICOUNT (BAG) ×1 IMPLANT
BENZOIN TINCTURE PRP APPL 2/3 (GAUZE/BANDAGES/DRESSINGS) ×1 IMPLANT
BIT DRILL OVR 3.5AO QC SHRT SM (DRILL) IMPLANT
BIT DRILL QC 2.5MM SHRT EVO SM (DRILL) IMPLANT
COVER SURGICAL LIGHT HANDLE (MISCELLANEOUS) ×1 IMPLANT
DRAIN PENROSE .5X12 LATEX STL (DRAIN) IMPLANT
DRAPE C-ARM 42X72 X-RAY (DRAPES) IMPLANT
DRAPE IMP U-DRAPE 54X76 (DRAPES) ×1 IMPLANT
DRAPE INCISE IOBAN 66X45 STRL (DRAPES) ×2 IMPLANT
DRAPE U-SHAPE 47X51 STRL (DRAPES) ×2 IMPLANT
DRSG AQUACEL AG ADV 3.5X10 (GAUZE/BANDAGES/DRESSINGS) IMPLANT
DRSG MEPILEX POST OP 4X12 (GAUZE/BANDAGES/DRESSINGS) IMPLANT
DRSG MEPILEX POST OP 4X8 (GAUZE/BANDAGES/DRESSINGS) ×1 IMPLANT
DURAPREP 26ML APPLICATOR (WOUND CARE) ×1 IMPLANT
ELECTRODE REM PT RTRN 9FT ADLT (ELECTROSURGICAL) ×1 IMPLANT
FACESHIELD WRAPAROUND OR TEAM (MASK) ×1 IMPLANT
GAUZE PAD ABD 8X10 STRL (GAUZE/BANDAGES/DRESSINGS) IMPLANT
GAUZE SPONGE 4X4 12PLY STRL (GAUZE/BANDAGES/DRESSINGS) IMPLANT
GAUZE XEROFORM 5X9 LF (GAUZE/BANDAGES/DRESSINGS) IMPLANT
GLOVE BIO SURGEON ST LM GN SZ9 (GLOVE) ×1 IMPLANT
GLOVE BIOGEL PI IND STRL 8 (GLOVE) ×1 IMPLANT
GLOVE BIOGEL PI MICRO 8.0 (GLOVE) ×1 IMPLANT
GOWN STRL REUS W/ TWL LRG LVL3 (GOWN DISPOSABLE) ×2 IMPLANT
GOWN STRL REUS W/ TWL XL LVL3 (GOWN DISPOSABLE) ×1 IMPLANT
KIT BASIN OR (CUSTOM PROCEDURE TRAY) ×1 IMPLANT
KIT TURNOVER KIT B (KITS) ×1 IMPLANT
MANIFOLD NEPTUNE II (INSTRUMENTS) ×1 IMPLANT
PACK SHOULDER (CUSTOM PROCEDURE TRAY) ×1 IMPLANT
PACK UNIVERSAL I (CUSTOM PROCEDURE TRAY) ×1 IMPLANT
PAD ARMBOARD POSITIONER FOAM (MISCELLANEOUS) ×2 IMPLANT
PENCIL BUTTON HOLSTER BLD 10FT (ELECTRODE) IMPLANT
PLATE NLOCK MID 108 10H LT (Plate) IMPLANT
SCREW CORT 3.5X14 ST EVOS (Screw) IMPLANT
SCREW CORT 3.5X15 ST EVOS (Screw) IMPLANT
SCREW CORT 3.5X16 ST EVOS (Screw) IMPLANT
SCREW CORT 3.5X17 ST EVOS (Screw) IMPLANT
SCREW CORT 3.5X20 ST EVOS (Screw) IMPLANT
SCREW CORT 3.5X22 ST EVOS (Screw) IMPLANT
SCREW CORTEX 3.5X18 EVOS (Screw) IMPLANT
SCREW LOCK EVOS ST 3.5X16 (Screw) IMPLANT
SLING ARM IMMOBILIZER LRG (SOFTGOODS) IMPLANT
SLING ARM IMMOBILIZER MED (SOFTGOODS) IMPLANT
SOLN 0.9% NACL POUR BTL 1000ML (IV SOLUTION) ×1 IMPLANT
SOLN STERILE WATER BTL 1000 ML (IV SOLUTION) ×1 IMPLANT
SPONGE T-LAP 4X18 ~~LOC~~+RFID (SPONGE) ×2 IMPLANT
STAPLER SKIN PROX 35W (STAPLE) IMPLANT
STRIP CLOSURE SKIN 1/2X4 (GAUZE/BANDAGES/DRESSINGS) IMPLANT
SUCTION TUBE FRAZIER 10FR DISP (SUCTIONS) IMPLANT
SUT MNCRL AB 3-0 PS2 27 (SUTURE) IMPLANT
SUT VIC AB 0 CT1 27XBRD ANBCTR (SUTURE) IMPLANT
SUT VIC AB 1 CT1 27XBRD ANBCTR (SUTURE) IMPLANT
SUT VIC AB 2-0 CT2 27 (SUTURE) IMPLANT
SUT VIC AB 2-0 CTB1 (SUTURE) IMPLANT
TOWEL GREEN STERILE (TOWEL DISPOSABLE) ×1 IMPLANT
TOWEL GREEN STERILE FF (TOWEL DISPOSABLE) ×1 IMPLANT
TUBE CONNECTING 12X1/4 (SUCTIONS) IMPLANT
YANKAUER SUCT BULB TIP NO VENT (SUCTIONS) IMPLANT

## 2024-04-21 NOTE — Transfer of Care (Signed)
 Immediate Anesthesia Transfer of Care Note  Patient: Ricky Berger  Procedure(s) Performed: OPEN REDUCTION INTERNAL FIXATION (ORIF) CLAVICULAR FRACTURE (Left: Shoulder)  Patient Location: PACU  Anesthesia Type:General  Level of Consciousness: drowsy  Airway & Oxygen Therapy: Patient Spontanous Breathing and Patient connected to face mask oxygen  Post-op Assessment: Report given to RN and Post -op Vital signs reviewed and stable  Post vital signs: Reviewed and stable  Last Vitals:  Vitals Value Taken Time  BP 122/57 04/21/24 10:40  Temp    Pulse 60 04/21/24 10:44  Resp 14 04/21/24 10:44  SpO2 100 % 04/21/24 10:44  Vitals shown include unfiled device data.  Last Pain:  Vitals:   04/21/24 0624  TempSrc:   PainSc: 2       Patients Stated Pain Goal: 0 (04/21/24 9375)  Complications: No notable events documented.

## 2024-04-21 NOTE — Anesthesia Procedure Notes (Signed)
 Procedure Name: Intubation Date/Time: 04/21/2024 7:38 AM  Performed by: Julien Manus, CRNAPre-anesthesia Checklist: Patient identified, Emergency Drugs available, Suction available and Patient being monitored Patient Re-evaluated:Patient Re-evaluated prior to induction Oxygen Delivery Method: Circle System Utilized Preoxygenation: Pre-oxygenation with 100% oxygen Induction Type: IV induction Ventilation: Mask ventilation without difficulty Laryngoscope Size: Mac Grade View: Grade I Tube type: Oral Tube size: 7.5 mm Number of attempts: 1 Airway Equipment and Method: Stylet and Oral airway Placement Confirmation: ETT inserted through vocal cords under direct vision, positive ETCO2 and breath sounds checked- equal and bilateral Secured at: 23 cm Tube secured with: Tape Dental Injury: Teeth and Oropharynx as per pre-operative assessment

## 2024-04-21 NOTE — Op Note (Signed)
 NAMECOLEMAN, KALAS MEDICAL RECORD NO: [id number] ACCOUNT NO: [id number] DATE OF BIRTH: 1980-11-03 FACILITY: [facility] LOCATION: [facility] PHYSICIAN: Cordella RAMAN. Addie, MD  Operative Report   DATE OF PROCEDURE: 04/21/2024  PREOPERATIVE DIAGNOSIS:  Left clavicle comminuted fracture.  POSTOPERATIVE DIAGNOSIS:  Left clavicle comminuted fracture.  PROCEDURE:  Left clavicle open reduction internal fixation using Smith and Nephew 10-hole plate.  SURGEON:  Cordella RAMAN. Addie, MD  ASSISTANT:  Herlene Calix, PA  INDICATIONS:  The patient is a 43 year old active patient with left clavicle fracture.  He presents for operative management after explanation of risks and benefits.  DESCRIPTION OF PROCEDURE:  The patient was brought to the operating room where general anesthetic was induced.  Preoperative antibiotics administered.  Timeout was called.  The patient was placed on the handy bed with the head at the elevated portion.   Left shoulder arm and hand prescrubbed with alcohol and Betadine allowed to air dry, prepped with DuraPrep solution and draped in a sterile manner.  Ioban was used to seal the operative field, cover the axilla and then cover the operative field.  Timeout  was called.  Incision made along the clavicle from its lateral aspect to about 4 fingerbreadths lateral to the sternum.  Skin and subcutaneous tissue were sharply divided.  Electrocautery was used to incise the fascia overlying the lateral aspect of the clavicle.   Crossing sensory nerves were preserved when possible.  There was 1 larger nerve, which was preserved which ended up going over the plate at the second hole from the medial aspect of the plate.  Meticulous subperiosteal dissection was performed to expose  the fracture ends.  The middle fragment and the lateral fragment were first exposed irrigation utilized, canals were opened using curettes.  The fracture was then reduced manually and held in place with lobster claw  clamp.  Reduction was confirmed.  Lag  screw was placed from anterior to posterior, which is a 3.5 lag screw into very good bone.  Good fixation was confirmed under fluoroscopy.  Next we used that construct to then reduce the middle and lateral fragments which are now 1 fragment to the medial  fragment.  This was done and also held with a lobster claw clamp.  A lag screw placed from anterior to posterior with good fixation achieved with the lag screw.  Next fluoroscopy was used to confirm good reduction of the fracture.  We then examined a 12-hole and a 10-hole plate.  Because of the length of the fracture we chose the 10-hole plate because that was going to be best seated in the middle of the clavicle throughout the length of the clavicle fracture and the clavicle  itself.  Nonlocking screws were placed medially and laterally.  Plate was bent for optimal contact onto the bone.  We were able to achieve 6 cortices on either side of the medial and lateral oblique fractures.  We also placed 2 screws into the middle  fracture fragment.  The combination of 6 cortices using both locking and nonlocking screws along with 4 cortices in the middle fragment along with interfragmentary fixation gave a very stable construct particularly in this good bone.  Fluoroscopy  confirmed good placement.  Next the arm was taken through range of motion and the fracture was stable.  Thorough irrigation was then performed with 3 liters of irrigating solution.  Skin edges anesthetized using Marcaine, morphine and clonidine.  Vancomycin  powder then placed over the hardware and the fascia was closed using #1  Vicryl sutures followed by  interrupted inverted 0 Vicryl suture 2-0 Vicryl suture and 3-0 Monocryl with Steri-Strips, Aquacel dressing and shoulder sling applied.  The patient will stay in the sling for 2 weeks in order to allow this incision to heal.  He will follow up with us  in  2 weeks for clinical recheck.  Luke's  assistance was required at all times for retraction, opening, closing and mobilization of tissue.  His assistance was medical necessity.   PUS D: 04/21/2024 10:14:53 am T: 04/21/2024 10:30:00 am  JOB: [id number]/ [id number]
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
## 2024-04-21 NOTE — Brief Op Note (Signed)
   04/21/2024  10:09 AM  PATIENT:  Lonni Nakai  43 y.o. male  PRE-OPERATIVE DIAGNOSIS:  left clavicle fracture  POST-OPERATIVE DIAGNOSIS:  left clavicle fracture  PROCEDURE:  Procedure(s): OPEN REDUCTION INTERNAL FIXATION (ORIF) CLAVICULAR FRACTURE  SURGEON:  Surgeon(s): Addie, Cordella Hamilton, MD  ASSISTANT: magnant pa  ANESTHESIA:   general  EBL: 50 ml    Total I/O In: 200 [IV Piggyback:200] Out: -   BLOOD ADMINISTERED: none  DRAINS: none   LOCAL MEDICATIONS USED:  marcaine morphine clonidine vanco  SPECIMEN:  No Specimen  COUNTS:  YES  TOURNIQUET:  * No tourniquets in log *  DICTATION: .Other Dictation: Dictation Number [id number]  PLAN OF CARE: Discharge to home after PACU  PATIENT DISPOSITION:  PACU - hemodynamically stable

## 2024-04-22 ENCOUNTER — Encounter (HOSPITAL_COMMUNITY): Payer: Self-pay | Admitting: Orthopedic Surgery

## 2024-04-22 NOTE — Anesthesia Postprocedure Evaluation (Signed)
 Anesthesia Post Note  Patient: Rykar Lebleu  Procedure(s) Performed: OPEN REDUCTION INTERNAL FIXATION (ORIF) CLAVICULAR FRACTURE (Left: Shoulder)     Patient location during evaluation: PACU Anesthesia Type: General Level of consciousness: awake and alert Pain management: pain level controlled Vital Signs Assessment: post-procedure vital signs reviewed and stable Respiratory status: spontaneous breathing, nonlabored ventilation, respiratory function stable and patient connected to nasal cannula oxygen Cardiovascular status: blood pressure returned to baseline and stable Postop Assessment: no apparent nausea or vomiting Anesthetic complications: no   No notable events documented.  Last Vitals:  Vitals:   04/21/24 1145 04/21/24 1200  BP: 123/73 123/75  Pulse: 67 62  Resp: 11 (!) 9  Temp:  36.4 C  SpO2: 99% 95%    Last Pain:  Vitals:   04/21/24 1158  TempSrc:   PainSc: 3                  Henya Aguallo L Zionah Criswell

## 2024-04-23 ENCOUNTER — Telehealth: Payer: Self-pay | Admitting: Orthopedic Surgery

## 2024-04-24 ENCOUNTER — Ambulatory Visit: Admit: 2024-04-24

## 2024-04-24 SURGERY — OPEN REDUCTION INTERNAL FIXATION (ORIF) CLAVICULAR FRACTURE
Anesthesia: General | Laterality: Left

## 2024-04-27 DIAGNOSIS — S42002A Fracture of unspecified part of left clavicle, initial encounter for closed fracture: Secondary | ICD-10-CM

## 2024-04-30 ENCOUNTER — Ambulatory Visit (INDEPENDENT_AMBULATORY_CARE_PROVIDER_SITE_OTHER): Admitting: Orthopedic Surgery

## 2024-04-30 ENCOUNTER — Other Ambulatory Visit (INDEPENDENT_AMBULATORY_CARE_PROVIDER_SITE_OTHER)

## 2024-04-30 ENCOUNTER — Encounter: Payer: Self-pay | Admitting: Orthopedic Surgery

## 2024-04-30 DIAGNOSIS — S42022A Displaced fracture of shaft of left clavicle, initial encounter for closed fracture: Secondary | ICD-10-CM

## 2024-04-30 MED ORDER — METHOCARBAMOL 500 MG PO TABS: 500.0000 mg | ORAL_TABLET | Freq: Three times a day (TID) | ORAL | 0 refills | Status: AC | PRN
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
## 2024-05-30 ENCOUNTER — Other Ambulatory Visit: Payer: Self-pay

## 2024-05-30 ENCOUNTER — Ambulatory Visit: Admitting: Orthopedic Surgery

## 2024-05-30 ENCOUNTER — Encounter: Payer: Self-pay | Admitting: Orthopedic Surgery

## 2024-05-30 DIAGNOSIS — S42022A Displaced fracture of shaft of left clavicle, initial encounter for closed fracture: Secondary | ICD-10-CM | POA: Diagnosis not present
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
## 2024-06-18 NOTE — Telephone Encounter (Signed)
 Dr Addie called--signing encounter

## 2024-06-27 ENCOUNTER — Encounter: Admitting: Orthopedic Surgery
